# Patient Record
Sex: Male | Born: 1945 | Hispanic: No | Marital: Married | Smoking: Former smoker
Health system: Southern US, Community
[De-identification: ages and names within clinical notes are randomized; demographics above are authoritative.]

## PROBLEM LIST (undated history)

## (undated) DIAGNOSIS — I10 Essential (primary) hypertension: Secondary | ICD-10-CM

## (undated) DIAGNOSIS — E669 Obesity, unspecified: Secondary | ICD-10-CM

## (undated) DIAGNOSIS — IMO0002 Reserved for concepts with insufficient information to code with codable children: Secondary | ICD-10-CM

## (undated) DIAGNOSIS — E7800 Pure hypercholesterolemia, unspecified: Secondary | ICD-10-CM

## (undated) DIAGNOSIS — I4891 Unspecified atrial fibrillation: Secondary | ICD-10-CM

## (undated) HISTORY — DX: Reserved for concepts with insufficient information to code with codable children: IMO0002

## (undated) HISTORY — DX: Unspecified atrial fibrillation: I48.91

## (undated) HISTORY — PX: APPENDECTOMY: SHX54

## (undated) HISTORY — DX: Obesity, unspecified: E66.9

## (undated) HISTORY — PX: HERNIA REPAIR: SHX51

---

## 2007-05-31 ENCOUNTER — Other Ambulatory Visit: Admission: RE | Admit: 2007-05-31 | Discharge: 2007-05-31 | Payer: Self-pay | Admitting: Family Medicine

## 2012-02-18 ENCOUNTER — Inpatient Hospital Stay (HOSPITAL_COMMUNITY)
Admission: EM | Admit: 2012-02-18 | Discharge: 2012-02-21 | DRG: 418 | Disposition: A | Discharge status: Home / Self Care | Payer: Medicare Other | Attending: General Surgery | Admitting: General Surgery

## 2012-02-18 ENCOUNTER — Encounter (HOSPITAL_COMMUNITY): Payer: Self-pay | Admitting: Family Medicine

## 2012-02-18 ENCOUNTER — Emergency Department (HOSPITAL_COMMUNITY): Payer: Medicare Other

## 2012-02-18 DIAGNOSIS — I4891 Unspecified atrial fibrillation: Secondary | ICD-10-CM | POA: Diagnosis present

## 2012-02-18 DIAGNOSIS — I4821 Permanent atrial fibrillation: Secondary | ICD-10-CM | POA: Diagnosis present

## 2012-02-18 DIAGNOSIS — Z6839 Body mass index (BMI) 39.0-39.9, adult: Secondary | ICD-10-CM

## 2012-02-18 DIAGNOSIS — K8001 Calculus of gallbladder with acute cholecystitis with obstruction: Principal | ICD-10-CM | POA: Diagnosis present

## 2012-02-18 DIAGNOSIS — E785 Hyperlipidemia, unspecified: Secondary | ICD-10-CM | POA: Diagnosis present

## 2012-02-18 DIAGNOSIS — K810 Acute cholecystitis: Secondary | ICD-10-CM

## 2012-02-18 DIAGNOSIS — D72829 Elevated white blood cell count, unspecified: Secondary | ICD-10-CM

## 2012-02-18 DIAGNOSIS — K8000 Calculus of gallbladder with acute cholecystitis without obstruction: Secondary | ICD-10-CM

## 2012-02-18 DIAGNOSIS — Z01810 Encounter for preprocedural cardiovascular examination: Secondary | ICD-10-CM

## 2012-02-18 DIAGNOSIS — I10 Essential (primary) hypertension: Secondary | ICD-10-CM | POA: Diagnosis present

## 2012-02-18 DIAGNOSIS — N179 Acute kidney failure, unspecified: Secondary | ICD-10-CM | POA: Diagnosis present

## 2012-02-18 HISTORY — DX: Essential (primary) hypertension: I10

## 2012-02-18 HISTORY — DX: Pure hypercholesterolemia, unspecified: E78.00

## 2012-02-18 LAB — COMPREHENSIVE METABOLIC PANEL
ALT: 14 U/L (ref 0–53)
AST: 14 U/L (ref 0–37)
Alkaline Phosphatase: 67 U/L (ref 39–117)
CO2: 23 mEq/L (ref 19–32)
Calcium: 9.3 mg/dL (ref 8.4–10.5)
GFR calc non Af Amer: 54 mL/min — ABNORMAL LOW (ref >90)
Potassium: 3.9 mEq/L (ref 3.5–5.1)
Sodium: 136 mEq/L (ref 135–145)
Total Protein: 7.3 g/dL (ref 6.0–8.3)

## 2012-02-18 LAB — DIFFERENTIAL
Basophils Relative: 0 % (ref 0–1)
Eosinophils Absolute: 0 10*3/uL (ref 0.0–0.7)
Monocytes Absolute: 2.1 10*3/uL — ABNORMAL HIGH (ref 0.1–1.0)
Monocytes Relative: 10 % (ref 3–12)

## 2012-02-18 LAB — CBC
HCT: 39.4 % (ref 39.0–52.0)
Hemoglobin: 13.7 g/dL (ref 13.0–17.0)
MCH: 34.1 pg — ABNORMAL HIGH (ref 26.0–34.0)
MCHC: 34.8 g/dL (ref 30.0–36.0)

## 2012-02-18 LAB — URINALYSIS, ROUTINE W REFLEX MICROSCOPIC
Ketones, ur: NEGATIVE mg/dL
Protein, ur: 100 mg/dL — AB
Urobilinogen, UA: 1 mg/dL (ref 0.0–1.0)

## 2012-02-18 LAB — URINE MICROSCOPIC-ADD ON

## 2012-02-18 MED ORDER — DIPHENHYDRAMINE HCL 50 MG/ML IJ SOLN: 12.5000 mg | Freq: Four times a day (QID) | INTRAMUSCULAR | Status: DC | PRN

## 2012-02-18 MED ORDER — ONDANSETRON HCL 4 MG/2ML IJ SOLN
4.0000 mg | Freq: Once | INTRAMUSCULAR | Status: AC
  Administered 2012-02-18: 4 mg via INTRAVENOUS
  Filled 2012-02-18: qty 2

## 2012-02-18 MED ORDER — METOPROLOL TARTRATE 25 MG PO TABS
25.0000 mg | ORAL_TABLET | Freq: Once | ORAL | Status: AC
  Administered 2012-02-18: 25 mg via ORAL
  Filled 2012-02-18: qty 1

## 2012-02-18 MED ORDER — DIPHENHYDRAMINE HCL 12.5 MG/5ML PO ELIX: 12.5000 mg | ORAL_SOLUTION | Freq: Four times a day (QID) | ORAL | Status: DC | PRN

## 2012-02-18 MED ORDER — ACETAMINOPHEN 325 MG PO TABS
650.0000 mg | ORAL_TABLET | Freq: Four times a day (QID) | ORAL | Status: DC | PRN
  Administered 2012-02-18: 650 mg via ORAL
  Filled 2012-02-18 (×2): qty 2

## 2012-02-18 MED ORDER — SODIUM CHLORIDE 0.9 % IV BOLUS (SEPSIS)
1000.0000 mL | Freq: Once | INTRAVENOUS | Status: AC
  Administered 2012-02-18: 1000 mL via INTRAVENOUS

## 2012-02-18 MED ORDER — PIPERACILLIN-TAZOBACTAM 3.375 G IVPB: 3.3750 g | Freq: Three times a day (TID) | INTRAVENOUS | Status: DC

## 2012-02-18 MED ORDER — SODIUM CHLORIDE 0.9 % IV SOLN
3.0000 g | Freq: Four times a day (QID) | INTRAVENOUS | Status: DC
  Administered 2012-02-18 – 2012-02-21 (×10): 3 g via INTRAVENOUS
  Filled 2012-02-18 (×12): qty 3

## 2012-02-18 MED ORDER — METOPROLOL TARTRATE 50 MG PO TABS
50.0000 mg | ORAL_TABLET | Freq: Two times a day (BID) | ORAL | Status: DC
  Administered 2012-02-19 – 2012-02-21 (×5): 50 mg via ORAL
  Filled 2012-02-18 (×6): qty 1

## 2012-02-18 MED ORDER — ACETAMINOPHEN 650 MG RE SUPP: 650.0000 mg | Freq: Four times a day (QID) | RECTAL | Status: DC | PRN

## 2012-02-18 MED ORDER — PANTOPRAZOLE SODIUM 40 MG IV SOLR
40.0000 mg | Freq: Every day | INTRAVENOUS | Status: DC
  Administered 2012-02-18 – 2012-02-20 (×3): 40 mg via INTRAVENOUS
  Filled 2012-02-18 (×4): qty 40

## 2012-02-18 MED ORDER — MORPHINE SULFATE 2 MG/ML IJ SOLN
1.0000 mg | INTRAMUSCULAR | Status: DC | PRN
Start: 2012-02-18 — End: 2012-02-19
  Administered 2012-02-18: 2 mg via INTRAVENOUS
  Filled 2012-02-18: qty 1

## 2012-02-18 MED ORDER — SODIUM CHLORIDE 0.9 % IV SOLN
3.0000 g | Freq: Once | INTRAVENOUS | Status: AC
  Administered 2012-02-18 – 2012-02-19 (×2): 3 g via INTRAVENOUS
  Filled 2012-02-18: qty 3

## 2012-02-18 MED ORDER — KCL IN DEXTROSE-NACL 20-5-0.45 MEQ/L-%-% IV SOLN
INTRAVENOUS | Status: DC
  Administered 2012-02-18 – 2012-02-19 (×2): via INTRAVENOUS
  Filled 2012-02-18 (×4): qty 1000

## 2012-02-18 MED ORDER — MORPHINE SULFATE 4 MG/ML IJ SOLN
4.0000 mg | Freq: Once | INTRAMUSCULAR | Status: AC
  Administered 2012-02-18: 4 mg via INTRAVENOUS
  Filled 2012-02-18: qty 1

## 2012-02-18 NOTE — ED Notes (Signed)
Patient transported to X-ray 

## 2012-02-18 NOTE — ED Notes (Signed)
Pt returned from xray

## 2012-02-18 NOTE — ED Notes (Signed)
Pt reports he began having abdominal cramping with N/V 5 days ago. States has not had any vomiting since Tuesday. Denies nausea at this time but reports continued abdominal pain.  Reports having increased weakness and confusion. States urine is dark brown. Went to pcp this morning and was referred here for kidney failure.

## 2012-02-18 NOTE — ED Provider Notes (Addendum)
History     CSN: [id number]  Arrival date & time 02/18/12  1013   First MD Initiated Contact with Patient 02/18/12 1037      Chief Complaint  Patient presents with  . Acute Renal Failure    sent by pcp    (Consider location/radiation/quality/duration/timing/severity/associated sxs/prior treatment) The history is provided by the patient, the spouse and medical records.   the patient is a 66 year old, morbidly obese, male, with a history of hypertension, and hypercholesterolemia.  He states that 5 days ago, he developed abdominal pain.  He went to the bathroom, and while he was having a hard bowel movement.  He started vomiting.  Since then, he has had persistent pain, and vomiting.  His wife returned from work today, and thought that he was confused, so they went to her primary care physician or he was examined and then sent to the emergency department for evaluation.  He denies cough, or shortness of breath.  He has had chills, but no fever.  He history of an appendectomy, and hernia repair.  He drinks alcohol.  He has never had peptic ulcer disease.  He denies smoking.  Cigarettes.  He denies a history of irregular heartbeat in the past.  5 caveat applies for severe illness and urgent need for intervention  Past Medical History  Diagnosis Date  . Hypertension   . High cholesterol     Past Surgical History  Procedure Date  . Hernia repair   . Appendectomy     History reviewed. No pertinent family history.  History  Substance Use Topics  . Smoking status: Former Games developer  . Smokeless tobacco: Not on file  . Alcohol Use: 1.2 oz/week    2 Glasses of wine per week      Review of Systems  Constitutional: Positive for chills and diaphoresis. Negative for fever.  Respiratory: Negative for cough and shortness of breath.   Cardiovascular: Negative for chest pain.  Gastrointestinal: Positive for nausea, vomiting and abdominal pain.  Neurological: Negative for headaches.    Psychiatric/Behavioral: Positive for confusion.  All other systems reviewed and are negative.    Allergies  Statins  Home Medications   Current Outpatient Rx  Name Route Sig Dispense Refill  . OMEGA-3 FATTY ACIDS 1000 MG PO CAPS Oral Take 4 g by mouth daily.    Marland Kitchen LISINOPRIL 10 MG PO TABS Oral Take 10 mg by mouth daily.    Marland Kitchen OMEPRAZOLE 20 MG PO CPDR Oral Take 20 mg by mouth daily.      BP 144/89  Pulse 111  Temp 99 F (37.2 C)  Resp 22  SpO2 97%  Physical Exam  Nursing note and vitals reviewed. Constitutional: He is oriented to person, place, and time. No distress.       Morbidly obese  HENT:  Head: Normocephalic and atraumatic.  Eyes: Conjunctivae and EOM are normal.  Neck: Normal range of motion. Neck supple.  Cardiovascular:  No murmur heard.      Tachycardia with irregular heartbeat  Pulmonary/Chest: Effort normal and breath sounds normal. No respiratory distress.  Abdominal: Soft. Bowel sounds are normal. He exhibits distension. There is tenderness. There is guarding. There is no rebound.       Right upper quadrant tenderness, with guarding No Left Sided tenderness No right lower quadrant tenderness  Neurological: He is alert and oriented to person, place, and time.  Skin: Skin is warm and dry.  Psychiatric: He has a normal mood and affect. Thought content  normal.    ED Course  Procedures (including critical care time) 66 year old, male, with abdominal pain, and nausea and vomiting for 5 days, presents to emergency department with right upper quadrant tenderness, and new-onset atrial fibrillation.   Labs Reviewed  COMPREHENSIVE METABOLIC PANEL  CBC  DIFFERENTIAL  URINALYSIS, ROUTINE W REFLEX MICROSCOPIC  LIPASE, BLOOD   No results found.   No diagnosis found.  ECG Atrial fibrillation, with rapid ventricular response, heart rate 126 beats per minute. Normal axis. PVCs. Nonspecific T-wave changes  2:56 PM abd pain improved.  Last meal  yesterday.    IV ABXS ordered. Consult to surg  3:11 PM Spoke with surgery.  They will come see her.   MDM  Abdominal pain Atrial fibrillation - new Acute cholecystitis         Cheri Guppy, MD 02/18/12 1458  Cheri Guppy, MD 02/18/12 [phone]

## 2012-02-18 NOTE — ED Notes (Signed)
US at bedside

## 2012-02-18 NOTE — ED Notes (Signed)
Pt given urinal and told of need for urine specimen. NAD noted at this time. Family remains at bedside. Will continue to monitor pt.

## 2012-02-18 NOTE — ED Notes (Signed)
[place] states she will be in dept shortly

## 2012-02-18 NOTE — Anesthesia Preprocedure Evaluation (Addendum)
Anesthesia Evaluation  Patient identified by MRN, date of birth, ID band Patient awake    Reviewed: Allergy & Precautions, H&P , NPO status , Patient's Chart, lab work & pertinent test results  Airway Mallampati: III TM Distance: >3 FB Neck ROM: full    Dental  (+) Edentulous Upper, Edentulous Lower and Dental Advisory Given   Pulmonary neg pulmonary ROS,  breath sounds clear to auscultation  Pulmonary exam normal       Cardiovascular Exercise Tolerance: Good hypertension, Pt. on medications negative cardio ROS  + dysrhythmias Atrial Fibrillation Rhythm:Irregular Rate:Normal  ? New onset A. Fib. Seen by cardiology last night and cleared for surgery.  Rate OK.   Neuro/Psych negative neurological ROS  negative psych ROS   GI/Hepatic negative GI ROS, Neg liver ROS,   Endo/Other  negative endocrine ROSMorbid obesity  Renal/GU negative Renal ROS  negative genitourinary   Musculoskeletal   Abdominal (+) + obese,   Peds  Hematology negative hematology ROS (+)   Anesthesia Other Findings   Reproductive/Obstetrics negative OB ROS                         Anesthesia Physical Anesthesia Plan  ASA: III  Anesthesia Plan: General   Post-op Pain Management:    Induction: Intravenous  Airway Management Planned: Oral ETT  Additional Equipment:   Intra-op Plan:   Post-operative Plan: Extubation in OR  Informed Consent: I have reviewed the patients History and Physical, chart, labs and discussed the procedure including the risks, benefits and alternatives for the proposed anesthesia with the patient or authorized representative who has indicated his/her understanding and acceptance.   Dental Advisory Given  Plan Discussed with: CRNA and Surgeon  Anesthesia Plan Comments:         Anesthesia Quick Evaluation

## 2012-02-18 NOTE — Consult Note (Signed)
Reason for Consult:Peri-operative cardiac risk assessment Referring Physician: Dr Ovidio Kin   [name] [name] is an 66 y.o. male with a past medical history significant for HTN, hyperlipidemia and chronic low back pain. HPI: He was admitted for abdominal pain and diagnosed with acute cholecystitis for which he requires urgent surgery tomorrow morning. Incidentally, he was found to be in AF with a HR varying between 100-126b/m hence the consult for perioperative risk assessment. This is the first time anyone has ever mentioned to him that he has AF. He currently does not feel the palpitations. He denies chest pain, shortness of breath, lightheadedness, orthopnea, PND or leg edema. He has never suffered a heart attack, no hx of CVD, no DM and no renal disease. His last cardiac stress test was in the 90s and was normal. Since then, there has been no need to have him evaluated for CAD. Notably, he rides a stationary bike 10 miles a day, 7days a week without trouble. 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   Past Medical History  Diagnosis Date  . Hypertension   . High cholesterol     Past Surgical History  Procedure Date  . Hernia repair   . Appendectomy     History reviewed. No pertinent family history.  Social History:  reports that he has quit smoking. His smoking use included Cigarettes. He has never used smokeless tobacco. He reports that he drinks about 1.2 ounces of alcohol per week. He reports that he does not use illicit drugs.  Allergies:  Allergies  Allergen Reactions  . Statins Other (See Comments)    Liver enzymes     Medications: I have reviewed the patient's current medications.  Results for orders placed during the hospital encounter of 02/18/12 (from the past 48 hour(s))  COMPREHENSIVE METABOLIC PANEL     Status: Abnormal   Collection Time   02/18/12 10:45 AM      Component Value Range Comment   Sodium 136  135 - 145 (mEq/L)    Potassium 3.9  3.5 - 5.1 (mEq/L)    Chloride 99  96 - 112 (mEq/L)    CO2 23  19 - 32 (mEq/L)    Glucose, Bld 110 (*) 70 - 99 (mg/dL)    BUN 22  6 - 23 (mg/dL)    Creatinine, Ser 4.78  0.50 - 1.35 (mg/dL)    Calcium 9.3  8.4 - 10.5 (mg/dL)    Total Protein 7.3  6.0 - 8.3 (g/dL)    Albumin 3.2 (*) 3.5 - 5.2 (g/dL)    AST 14  0 - 37 (U/L)    ALT 14  0 - 53 (U/L)    Alkaline Phosphatase 67  39 - 117 (U/L)    Total Bilirubin 1.2  0.3 - 1.2 (mg/dL)    GFR calc non Af Amer 54 (*) >90 (mL/min)    GFR calc Af Amer 63 (*) >90 (mL/min)   CBC     Status: Abnormal   Collection Time   02/18/12 10:45 AM      Component Value Range Comment   WBC 20.6 (*) 4.0 - 10.5 (K/uL)    RBC 4.02 (*) 4.22 - 5.81 (MIL/uL)    Hemoglobin 13.7  13.0 - 17.0 (g/dL)    HCT 29.5  62.1 - 30.8 (%)    MCV 98.0  78.0 - 100.0 (fL)    MCH 34.1 (*) 26.0 - 34.0 (pg)    MCHC 34.8  30.0 - 36.0 (g/dL)    RDW 16.1  09.6 - 04.5 (%)    Platelets 184  150 - 400 (K/uL)   DIFFERENTIAL     Status: Abnormal   Collection Time   02/18/12 10:45 AM      Component Value Range Comment   Neutrophils Relative 80 (*) 43 - 77 (%)    Neutro Abs 16.4 (*) 1.7 - 7.7 (K/uL)    Lymphocytes Relative 10 (*) 12 - 46 (%)    Lymphs Abs 2.0  0.7 - 4.0 (K/uL)    Monocytes Relative 10  3 - 12 (%)    Monocytes Absolute 2.1 (*) 0.1 - 1.0 (K/uL)    Eosinophils Relative 0  0 - 5 (%)    Eosinophils Absolute 0.0  0.0 - 0.7 (K/uL)    Basophils Relative 0  0 - 1 (%)    Basophils Absolute 0.0  0.0 - 0.1 (K/uL)   LIPASE, BLOOD     Status: Normal   Collection Time   02/18/12 10:45 AM      Component Value Range Comment   Lipase 16  11 - 59 (U/L)   POCT I-STAT TROPONIN I     Status: Normal   Collection Time   02/18/12 10:58 AM      Component Value Range Comment   Troponin i, poc 0.02  0.00 - 0.08 (ng/mL)    Comment 3            URINALYSIS, ROUTINE W REFLEX MICROSCOPIC     Status: Abnormal   Collection Time   02/18/12 11:50 AM      Component Value Range  Comment   Color, Urine AMBER (*) YELLOW  BIOCHEMICALS MAY BE AFFECTED BY COLOR   APPearance CLOUDY (*) CLEAR     Specific Gravity, Urine 1.020  1.005 - 1.030     pH 5.5  5.0 - 8.0     Glucose, UA NEGATIVE  NEGATIVE (mg/dL)    Hgb urine dipstick LARGE (*) NEGATIVE     Bilirubin Urine SMALL (*) NEGATIVE     Ketones, ur NEGATIVE  NEGATIVE (mg/dL)    Protein, ur 409 (*) NEGATIVE (mg/dL)    Urobilinogen, UA 1.0  0.0 - 1.0 (mg/dL)    Nitrite NEGATIVE  NEGATIVE     Leukocytes, UA SMALL (*) NEGATIVE    URINE MICROSCOPIC-ADD ON     Status: Abnormal   Collection Time   02/18/12 11:50 AM      Component Value Range Comment   Squamous Epithelial / LPF FEW (*) RARE     WBC, UA 7-10  <3 (WBC/hpf)    Bacteria, UA FEW (*) RARE     Urine-Other MUCOUS PRESENT       US Abdomen Complete  02/18/2012  *RADIOLOGY REPORT*  Clinical Data:  Question cholecystitis.  COMPLETE ABDOMINAL ULTRASOUND  Comparison:  Acute abdominal series 02/18/2012  Findings:  Gallbladder:  There is a 2 cm gallstone in the gallbladder neck. There is gallbladder wall thickening, measuring up to 8 mm.  There is suggestion of a small amount of pericholecystic fluid.  The sonographic Murphy's sign is positive.  Common bile duct:  Measures 4.4 mm were imaged.  Liver:  No focal lesion identified.  Within normal limits in parenchymal echogenicity.  IVC:  Appears normal.  Pancreas:  The pancreas is not well visualized, due to bowel gas in this region.  Spleen:  Normal size and echotexture without focal parenchymal abnormality.  Right Kidney:  No hydronephrosis.  Well-preserved cortex.  Normal size and parenchymal echotexture without focal abnormalities.  Left Kidney:  No hydronephrosis.  Well-preserved cortex.  Normal size and parenchymal echotexture .  Upper pole 1.9 x 1.7 x 1.7 cm cyst.  Abdominal aorta:  Limited visualization, due to bowel gas.  The proximal abdominal aorta measures 228 cm AP diameter.  The mid to distal abdominal aorta could not  be visualized.  IMPRESSION:  1.  Positive study.  Sonographic findings consistent with acute cholecystitis (stone in the gallbladder neck, gallbladder wall thickening, and positive sonographic Murphy's sign). 2.  Evaluation of the pancreas and mid to distal abdominal aorta are markedly limited due to bowel gas.  Original Report Authenticated By: Britta Mccreedy, M.D.   Dg Abd Acute W/chest  02/18/2012  *RADIOLOGY REPORT*  Clinical Data: Question small bowel obstruction  ACUTE ABDOMEN SERIES (ABDOMEN 2 VIEW & CHEST 1 VIEW)  Comparison: None.  Findings: Heart, mediastinal, hilar contours are normal.  The trachea is midline.  The there is streaky atelectasis in the left lung base.  No visible pleural effusion or pneumothorax.  No evidence of free intraperitoneal air.  Two oval radiopaque densities projecting over the right lower quadrant are likely ingested pills. The bowel gas pattern is nonobstructive.  Three similar appearing calcifications in the left pelvis are most likely phleboliths, and two of them are more inferiorly positioned than the expected location of the left ureter. The most proximal of these calcifications could potentially be a distal left ureteral stone, but a phlebolith is favored.  IMPRESSION:  1.  Nonobstructive bowel gas pattern. 2.  No acute cardiopulmonary disease. 3.  Cannot completely exclude a distal left ureteral stone.  Left pelvic calcifications favored to be phleboliths.  Question if the patient has left flank pain.  Original Report Authenticated By: Britta Mccreedy, M.D.    Review of Systems  Constitutional: Positive for fever and chills.  HENT: Negative.   Eyes: Negative.   Respiratory: Negative.   Cardiovascular: Negative.   Gastrointestinal: Positive for abdominal pain.  Genitourinary: Negative.   Musculoskeletal: Positive for back pain.  Skin: Negative.   Neurological: Negative.   Endo/Heme/Allergies: Negative.   Psychiatric/Behavioral: Negative.    Blood pressure  161/78, pulse 81, temperature 98.1 F (36.7 C), temperature source Oral, resp. rate 20, height 5\' 10"  (1.778 m), weight 277 lb 12.5 oz (126 kg), SpO2 98.00%. Physical Exam  Constitutional: He is oriented to person, place, and time. He appears well-developed and well-nourished. No distress.  HENT:  Head: Normocephalic.  Mouth/Throat: No oropharyngeal exudate.  Eyes: Conjunctivae are normal. Pupils are equal, round, and reactive to light.  Neck: No JVD present.  Cardiovascular: S2 normal and normal pulses.  An irregularly irregular rhythm present. Tachycardia present.  PMI is not displaced.  Exam reveals no gallop, no distant heart sounds and no friction rub.   No murmur heard.      Varying S1  Respiratory: Effort normal and breath sounds normal. No respiratory distress. He has no wheezes. He has no rales. He exhibits no tenderness.  GI: There is tenderness. There is guarding.  Musculoskeletal: Normal range of motion.  Neurological: He is alert and oriented to person, place, and time.  Skin: Skin is warm and dry. No rash noted. He is not diaphoretic.  Psychiatric: He has a normal mood and affect. His behavior is normal. Judgment and thought content normal.   EKG AF at 126b/m.  Assessment/Plan: Newly detected atrial fibrillation. CCS SAF score  0, CHADS2VASc score 2  Patient has no active cardiopulmonary symptoms in-spite of his AF with RVR at 126 b/m. He has no other clinical risk predictors of high peri-operative risks. Being able to ride his stationary bike for 10 miles, 7 days a week is predictive of > 4 METS. His proposed surgery is one with intermediate risk for perioperative risk for cardiac morbidity and mortality but it is also one that requires some urgency. It is prudent to treat his AF with rate control with a goal of maintaining a heart rate of <100 or as close to this as possible without undergoing further testing prior to surgery. I have written for a dose of metoprolol 25 mg to  be given  now, if he tolerates that dose, he will continue in 6 hrs time with 50 mg twice daily. If HR is uncontrolled prior to surgery, he may be started on an infusion of IV Esmolol or diltiazem.Given the risk of stroke in AF patients and his CHADS2VASc score of 2 (1 point for age>65 and another for HTN), he will benefit from prevention with an anticoagulant. This will need to be addressed as soon as it is safe and feasible post op.Upon discharge from the hospital, he will need cardiology follow up of his AF.  It was indeed a pleasure meeting Mr. [employer] and we thank you for the opportunity to take part in his care.   Grandville Silos 02/18/2012, 11:08 PM

## 2012-02-18 NOTE — H&P (Signed)
[name] [name] is an 66 y.o. male.    Primary care: Dr. Juluis Rainier  Chief Complaint: Abdominal pain nausea and vomiting for 3 days. HPI: Patient is a 66 year old gentleman who was in his normal state of health until Tuesday, 02/15/2012. He will get morning with abdominal pain on the right side. He went to the bathroom then developed nausea and vomiting. He's had ongoing episodes of abdominal pain nausea and vomiting since Tuesday. His wife came back to check on him this morning and he was confused. He was seen by Dr. Zachery Dauer office found to have an acute abdomen, dehydration and proteinuria. He was sent to the emergency room and [facility].   Workup here shows a white count of 20,600. Creatinine of 1.3 to BUN of 22. Three-way abdomen was normal except for possible left ureteral stone. Abdominal ultrasound shows a 2 cm gallstone in the neck of the gallbladder gallbladder wall thickening measuring up to 8 mm. There is a suggestion of some pericholecystic fluid and positive Murphy sign. Common bile duct was 4.4 mm. This is consistent with acute cholecystitis. We are asked to see the patient. He's been started on IV Unasyn by the emergency room physician.  Past Medical History  Diagnosis Date  . Hypertension   . High cholesterol     Past Surgical History  Procedure Date  . Hernia repair right   . Appendectomy     History reviewed. No pertinent family history. Social History:  reports that he has quit smoking. His smoking use included Cigarettes. He has never used smokeless tobacco. He reports that he drinks about 1.2 ounces of alcohol per week. He reports that he does not use illicit drugs.Tobacco about 2PPD  15 years, quit early 30's ETOH: 6 pack weekends, 1 bottle of wine per week with meals. Allergies:  Allergies  Allergen Reactions  . Statins Other (See Comments)    Liver enzymes    Prior to Admission medications   Medication Sig Start Date End Date Taking? Authorizing  Provider  fish oil-omega-3 fatty acids 1000 MG capsule Take 4 g by mouth daily.   Yes Historical Provider, MD  lisinopril (PRINIVIL,ZESTRIL) 10 MG tablet Take 10 mg by mouth daily.   Yes Historical Provider, MD  omeprazole (PRILOSEC) 20 MG capsule Take 20 mg by mouth daily.   Yes Historical Provider, MD     (Not in a hospital admission)  Results for orders placed during the hospital encounter of 02/18/12 (from the past 48 hour(s))  COMPREHENSIVE METABOLIC PANEL     Status: Abnormal   Collection Time   02/18/12 10:45 AM      Component Value Range Comment   Sodium 136  135 - 145 (mEq/L)    Potassium 3.9  3.5 - 5.1 (mEq/L)    Chloride 99  96 - 112 (mEq/L)    CO2 23  19 - 32 (mEq/L)    Glucose, Bld 110 (*) 70 - 99 (mg/dL)    BUN 22  6 - 23 (mg/dL)    Creatinine, Ser 7.82  0.50 - 1.35 (mg/dL)    Calcium 9.3  8.4 - 10.5 (mg/dL)    Total Protein 7.3  6.0 - 8.3 (g/dL)    Albumin 3.2 (*) 3.5 - 5.2 (g/dL)    AST 14  0 - 37 (U/L)    ALT 14  0 - 53 (U/L)    Alkaline Phosphatase 67  39 - 117 (U/L)    Total Bilirubin 1.2  0.3 - 1.2 (  mg/dL)    GFR calc non Af Amer 54 (*) >90 (mL/min)    GFR calc Af Amer 63 (*) >90 (mL/min)   CBC     Status: Abnormal   Collection Time   02/18/12 10:45 AM      Component Value Range Comment   WBC 20.6 (*) 4.0 - 10.5 (K/uL)    RBC 4.02 (*) 4.22 - 5.81 (MIL/uL)    Hemoglobin 13.7  13.0 - 17.0 (g/dL)    HCT 16.1  09.6 - 04.5 (%)    MCV 98.0  78.0 - 100.0 (fL)    MCH 34.1 (*) 26.0 - 34.0 (pg)    MCHC 34.8  30.0 - 36.0 (g/dL)    RDW 40.9  81.1 - 91.4 (%)    Platelets 184  150 - 400 (K/uL)   DIFFERENTIAL     Status: Abnormal   Collection Time   02/18/12 10:45 AM      Component Value Range Comment   Neutrophils Relative 80 (*) 43 - 77 (%)    Neutro Abs 16.4 (*) 1.7 - 7.7 (K/uL)    Lymphocytes Relative 10 (*) 12 - 46 (%)    Lymphs Abs 2.0  0.7 - 4.0 (K/uL)    Monocytes Relative 10  3 - 12 (%)    Monocytes Absolute 2.1 (*) 0.1 - 1.0 (K/uL)    Eosinophils  Relative 0  0 - 5 (%)    Eosinophils Absolute 0.0  0.0 - 0.7 (K/uL)    Basophils Relative 0  0 - 1 (%)    Basophils Absolute 0.0  0.0 - 0.1 (K/uL)   LIPASE, BLOOD     Status: Normal   Collection Time   02/18/12 10:45 AM      Component Value Range Comment   Lipase 16  11 - 59 (U/L)   POCT I-STAT TROPONIN I     Status: Normal   Collection Time   02/18/12 10:58 AM      Component Value Range Comment   Troponin i, poc 0.02  0.00 - 0.08 (ng/mL)    Comment 3            URINALYSIS, ROUTINE W REFLEX MICROSCOPIC     Status: Abnormal   Collection Time   02/18/12 11:50 AM      Component Value Range Comment   Color, Urine AMBER (*) YELLOW  BIOCHEMICALS MAY BE AFFECTED BY COLOR   APPearance CLOUDY (*) CLEAR     Specific Gravity, Urine 1.020  1.005 - 1.030     pH 5.5  5.0 - 8.0     Glucose, UA NEGATIVE  NEGATIVE (mg/dL)    Hgb urine dipstick LARGE (*) NEGATIVE     Bilirubin Urine SMALL (*) NEGATIVE     Ketones, ur NEGATIVE  NEGATIVE (mg/dL)    Protein, ur 782 (*) NEGATIVE (mg/dL)    Urobilinogen, UA 1.0  0.0 - 1.0 (mg/dL)    Nitrite NEGATIVE  NEGATIVE     Leukocytes, UA SMALL (*) NEGATIVE    URINE MICROSCOPIC-ADD ON     Status: Abnormal   Collection Time   02/18/12 11:50 AM      Component Value Range Comment   Squamous Epithelial / LPF FEW (*) RARE     WBC, UA 7-10  <3 (WBC/hpf)    Bacteria, UA FEW (*) RARE     Urine-Other MUCOUS PRESENT      US Abdomen Complete  02/18/2012  *RADIOLOGY REPORT*  Clinical Data:  Question cholecystitis.  COMPLETE ABDOMINAL ULTRASOUND  Comparison:  Acute abdominal series 02/18/2012  Findings:  Gallbladder:  There is a 2 cm gallstone in the gallbladder neck. There is gallbladder wall thickening, measuring up to 8 mm.  There is suggestion of a small amount of pericholecystic fluid.  The sonographic Murphy's sign is positive.  Common bile duct:  Measures 4.4 mm were imaged.  Liver:  No focal lesion identified.  Within normal limits in parenchymal echogenicity.  IVC:   Appears normal.  Pancreas:  The pancreas is not well visualized, due to bowel gas in this region.  Spleen:  Normal size and echotexture without focal parenchymal abnormality.  Right Kidney:  No hydronephrosis.  Well-preserved cortex.  Normal size and parenchymal echotexture without focal abnormalities.  Left Kidney:  No hydronephrosis.  Well-preserved cortex.  Normal size and parenchymal echotexture .  Upper pole 1.9 x 1.7 x 1.7 cm cyst.  Abdominal aorta:  Limited visualization, due to bowel gas.  The proximal abdominal aorta measures 228 cm AP diameter.  The mid to distal abdominal aorta could not be visualized.  IMPRESSION:  1.  Positive study.  Sonographic findings consistent with acute cholecystitis (stone in the gallbladder neck, gallbladder wall thickening, and positive sonographic Murphy's sign). 2.  Evaluation of the pancreas and mid to distal abdominal aorta are markedly limited due to bowel gas.  Original Report Authenticated By: Britta Mccreedy, M.D.   Dg Abd Acute W/chest  02/18/2012  *RADIOLOGY REPORT*  Clinical Data: Question small bowel obstruction  ACUTE ABDOMEN SERIES (ABDOMEN 2 VIEW & CHEST 1 VIEW)  Comparison: None.  Findings: Heart, mediastinal, hilar contours are normal.  The trachea is midline.  The there is streaky atelectasis in the left lung base.  No visible pleural effusion or pneumothorax.  No evidence of free intraperitoneal air.  Two oval radiopaque densities projecting over the right lower quadrant are likely ingested pills. The bowel gas pattern is nonobstructive.  Three similar appearing calcifications in the left pelvis are most likely phleboliths, and two of them are more inferiorly positioned than the expected location of the left ureter. The most proximal of these calcifications could potentially be a distal left ureteral stone, but a phlebolith is favored.  IMPRESSION:  1.  Nonobstructive bowel gas pattern. 2.  No acute cardiopulmonary disease. 3.  Cannot completely exclude a  distal left ureteral stone.  Left pelvic calcifications favored to be phleboliths.  Question if the patient has left flank pain.  Original Report Authenticated By: Britta Mccreedy, M.D.    Review of Systems  Constitutional: Positive for fever, chills, malaise/fatigue and diaphoresis. Negative for weight loss.  HENT: Negative.   Eyes: Negative.   Respiratory: Negative.   Cardiovascular: Negative.   Gastrointestinal: Positive for nausea, vomiting, abdominal pain and constipation. Negative for heartburn, diarrhea, blood in stool and melena.  Genitourinary: Negative.   Musculoskeletal: Positive for back pain.       Disc dz/spinal stenosis  Skin: Negative.   Neurological: Negative.  Negative for weakness.  Endo/Heme/Allergies: Negative.   Psychiatric/Behavioral: Negative.     Blood pressure 112/69, pulse 108, temperature 99.5 F (37.5 C), temperature source Oral, resp. rate 19, SpO2 97.00%. Physical Exam  Constitutional: He is oriented to person, place, and time. He appears well-developed and well-nourished.       bmi 39.7  HENT:  Head: Normocephalic and atraumatic.  Nose: Nose normal.  Mouth/Throat: Oropharynx is clear and moist.  Eyes: Conjunctivae and EOM are normal. Pupils are equal, round, and reactive to light.  Right eye exhibits no discharge. Left eye exhibits no discharge. No scleral icterus.  Neck: Normal range of motion. Neck supple. No JVD present. No tracheal deviation present. No thyromegaly present.  Cardiovascular: Normal rate, regular rhythm, normal heart sounds and intact distal pulses.  Exam reveals no gallop.   No murmur heard. Respiratory: Effort normal and breath sounds normal. No respiratory distress. He has no wheezes. He has no rales. He exhibits no tenderness.  GI: Soft. Bowel sounds are normal. He exhibits no distension. There is tenderness (RUQ). There is rebound (ruq) and guarding.  Musculoskeletal: Normal range of motion. He exhibits no edema.  Lymphadenopathy:     He has no cervical adenopathy.  Neurological: He is alert and oriented to person, place, and time. He has normal reflexes. No cranial nerve deficit. Coordination normal.  Skin: Skin is warm and dry. No rash noted. No erythema. No pallor.  Psychiatric: He has a normal mood and affect. His behavior is normal. Judgment and thought content normal.     Assessment/Plan 1. Acute cholecystitis, cholelithiasis.  Will need lap chole. 2. Dehydration 3. Hypertension 4. Dyslipidemia 6. BMI 39.7 7. Disc disease and some spinal stenosis 8.  New onset atrial fibrillation.  Plan: Will admit rehydrate start antibiotic and discuss cholecystectomy timing with Dr. Ezzard Standing.  JENNINGS,WILLARD 02/18/2012, 4:07 PM   The wife and daughter are at the bed side.  They have both had lap cholecystectomies! In addition to his gall bladder disease, Mr. Rutledge has new onset A. Fib.  I spoke with Dr. Michele Rockers, who will see the patient in consultation regarding the atrial fibrillation.  If there are no significant cardiac issues, I plan cholecystectomy in the AM.  I discussed with the patient the indications and risks of gall bladder surgery.  The primary risks of gall bladder surgery include, but are not limited to, bleeding, infection, common bile duct injury, and open surgery.  There is also the risk that the patient may have continued symptoms after surgery.  However, the likelihood of improvement in symptoms and return to the patient's normal status is good. We discussed the typical post-operative recovery course. I tried to answer the patient's questions. I drew a picture of the planned surgery.  Ovidio Kin, MD, [facility] Pager: [phone] Office phone:  [phone]

## 2012-02-18 NOTE — ED Notes (Signed)
MD at bedside. 

## 2012-02-19 ENCOUNTER — Encounter (HOSPITAL_COMMUNITY): Payer: Self-pay | Admitting: Anesthesiology

## 2012-02-19 ENCOUNTER — Inpatient Hospital Stay (HOSPITAL_COMMUNITY): Payer: Medicare Other

## 2012-02-19 ENCOUNTER — Encounter (HOSPITAL_COMMUNITY): Admission: EM | Disposition: A | Discharge status: Home / Self Care | Payer: Self-pay | Source: Home / Self Care

## 2012-02-19 ENCOUNTER — Inpatient Hospital Stay (HOSPITAL_COMMUNITY): Payer: Medicare Other | Admitting: Anesthesiology

## 2012-02-19 HISTORY — PX: CHOLECYSTECTOMY: SHX55

## 2012-02-19 LAB — SURGICAL PCR SCREEN
MRSA, PCR: NEGATIVE
Staphylococcus aureus: NEGATIVE

## 2012-02-19 LAB — CBC
HCT: 35 % — ABNORMAL LOW (ref 39.0–52.0)
Hemoglobin: 11.8 g/dL — ABNORMAL LOW (ref 13.0–17.0)
MCHC: 33.7 g/dL (ref 30.0–36.0)
RBC: 3.56 MIL/uL — ABNORMAL LOW (ref 4.22–5.81)
WBC: 13.2 10*3/uL — ABNORMAL HIGH (ref 4.0–10.5)

## 2012-02-19 LAB — BASIC METABOLIC PANEL
CO2: 25 mEq/L (ref 19–32)
Chloride: 100 mEq/L (ref 96–112)
Creatinine, Ser: 1.12 mg/dL (ref 0.50–1.35)
GFR calc Af Amer: 77 mL/min — ABNORMAL LOW (ref >90)
Potassium: 4.3 mEq/L (ref 3.5–5.1)
Sodium: 134 mEq/L — ABNORMAL LOW (ref 135–145)

## 2012-02-19 LAB — MAGNESIUM: Magnesium: 2.2 mg/dL (ref 1.5–2.5)

## 2012-02-19 SURGERY — LAPAROSCOPIC CHOLECYSTECTOMY WITH INTRAOPERATIVE CHOLANGIOGRAM
Anesthesia: General | Site: Abdomen | Wound class: Contaminated

## 2012-02-19 MED ORDER — ENOXAPARIN SODIUM 40 MG/0.4ML ~~LOC~~ SOLN
40.0000 mg | SUBCUTANEOUS | Status: DC
  Administered 2012-02-19: 40 mg via SUBCUTANEOUS
  Filled 2012-02-19 (×2): qty 0.4

## 2012-02-19 MED ORDER — PHENYLEPHRINE HCL 10 MG/ML IJ SOLN
INTRAMUSCULAR | Status: DC | PRN
  Administered 2012-02-19 (×2): 40 ug via INTRAVENOUS
  Administered 2012-02-19: 80 ug via INTRAVENOUS

## 2012-02-19 MED ORDER — HYDROCODONE-ACETAMINOPHEN 5-325 MG PO TABS
1.0000 | ORAL_TABLET | ORAL | Status: DC | PRN
  Administered 2012-02-20 – 2012-02-21 (×3): 2 via ORAL
  Filled 2012-02-19 (×3): qty 2

## 2012-02-19 MED ORDER — LACTATED RINGERS IV SOLN: INTRAVENOUS | Status: DC

## 2012-02-19 MED ORDER — LACTATED RINGERS IR SOLN
Status: DC | PRN
  Administered 2012-02-19: 1000 mL

## 2012-02-19 MED ORDER — HYDROMORPHONE HCL PF 1 MG/ML IJ SOLN: 0.2500 mg | INTRAMUSCULAR | Status: DC | PRN

## 2012-02-19 MED ORDER — BUPIVACAINE HCL (PF) 0.25 % IJ SOLN
INTRAMUSCULAR | Status: DC | PRN
  Administered 2012-02-19: 22 mL

## 2012-02-19 MED ORDER — MIDAZOLAM HCL 5 MG/5ML IJ SOLN
INTRAMUSCULAR | Status: DC | PRN
  Administered 2012-02-19: 2 mg via INTRAVENOUS

## 2012-02-19 MED ORDER — LIDOCAINE HCL (CARDIAC) 20 MG/ML IV SOLN
INTRAVENOUS | Status: DC | PRN
  Administered 2012-02-19: 50 mg via INTRAVENOUS

## 2012-02-19 MED ORDER — NEOSTIGMINE METHYLSULFATE 1 MG/ML IJ SOLN
INTRAMUSCULAR | Status: DC | PRN
  Administered 2012-02-19: 4 mg via INTRAVENOUS

## 2012-02-19 MED ORDER — HYDROMORPHONE HCL PF 1 MG/ML IJ SOLN
INTRAMUSCULAR | Status: DC | PRN
  Administered 2012-02-19 (×2): 0.5 mg via INTRAVENOUS
  Administered 2012-02-19: 1 mg via INTRAVENOUS

## 2012-02-19 MED ORDER — GLYCOPYRROLATE 0.2 MG/ML IJ SOLN
INTRAMUSCULAR | Status: DC | PRN
  Administered 2012-02-19: .7 mg via INTRAVENOUS

## 2012-02-19 MED ORDER — ONDANSETRON HCL 4 MG/2ML IJ SOLN
4.0000 mg | Freq: Four times a day (QID) | INTRAMUSCULAR | Status: DC | PRN
Start: 2012-02-19 — End: 2012-02-21

## 2012-02-19 MED ORDER — 0.9 % SODIUM CHLORIDE (POUR BTL) OPTIME
TOPICAL | Status: DC | PRN
  Administered 2012-02-19: 6000 mL

## 2012-02-19 MED ORDER — MORPHINE SULFATE 2 MG/ML IJ SOLN
1.0000 mg | INTRAMUSCULAR | Status: DC | PRN
  Administered 2012-02-19 – 2012-02-20 (×3): 2 mg via INTRAVENOUS
  Filled 2012-02-19 (×3): qty 1

## 2012-02-19 MED ORDER — IOHEXOL 300 MG/ML  SOLN
INTRAMUSCULAR | Status: DC | PRN
  Administered 2012-02-19: 50 mL

## 2012-02-19 MED ORDER — ROCURONIUM BROMIDE 100 MG/10ML IV SOLN
INTRAVENOUS | Status: DC | PRN
  Administered 2012-02-19: 50 mg via INTRAVENOUS
  Administered 2012-02-19 (×3): 10 mg via INTRAVENOUS

## 2012-02-19 MED ORDER — ONDANSETRON HCL 4 MG PO TABS: 4.0000 mg | ORAL_TABLET | Freq: Four times a day (QID) | ORAL | Status: DC | PRN

## 2012-02-19 MED ORDER — LACTATED RINGERS IV SOLN
INTRAVENOUS | Status: DC | PRN
  Administered 2012-02-19: 10:00:00 via INTRAVENOUS

## 2012-02-19 MED ORDER — PROPOFOL 10 MG/ML IV BOLUS
INTRAVENOUS | Status: DC | PRN
  Administered 2012-02-19: 200 mg via INTRAVENOUS

## 2012-02-19 MED ORDER — ONDANSETRON HCL 4 MG/2ML IJ SOLN
INTRAMUSCULAR | Status: DC | PRN
  Administered 2012-02-19: 4 mg via INTRAVENOUS

## 2012-02-19 MED ORDER — FENTANYL CITRATE 0.05 MG/ML IJ SOLN
INTRAMUSCULAR | Status: DC | PRN
  Administered 2012-02-19: 50 ug via INTRAVENOUS
  Administered 2012-02-19: 100 ug via INTRAVENOUS
  Administered 2012-02-19 (×2): 50 ug via INTRAVENOUS

## 2012-02-19 MED ORDER — KCL IN DEXTROSE-NACL 20-5-0.45 MEQ/L-%-% IV SOLN
INTRAVENOUS | Status: DC
  Administered 2012-02-19 – 2012-02-20 (×2): via INTRAVENOUS
  Administered 2012-02-20: 1000 mL via INTRAVENOUS
  Filled 2012-02-19 (×4): qty 1000

## 2012-02-19 SURGICAL SUPPLY — 47 items
APPLIER CLIP 5 13 M/L LIGAMAX5 (MISCELLANEOUS) ×2
APPLIER CLIP ROT 10 11.4 M/L (STAPLE) ×2
BENZOIN TINCTURE PRP APPL 2/3 (GAUZE/BANDAGES/DRESSINGS) IMPLANT
CABLE HI FREQUENCY MONOPOLAR (ELECTROSURGICAL) ×2 IMPLANT
CANISTER SUCTION 2500CC (MISCELLANEOUS) ×2 IMPLANT
CHLORAPREP W/TINT 26ML (MISCELLANEOUS) ×2 IMPLANT
CHOLANGIOGRAM CATH TAUT (CATHETERS) ×2 IMPLANT
CLIP APPLIE 5 13 M/L LIGAMAX5 (MISCELLANEOUS) ×1 IMPLANT
CLIP APPLIE ROT 10 11.4 M/L (STAPLE) ×1 IMPLANT
CLOTH BEACON ORANGE TIMEOUT ST (SAFETY) ×2 IMPLANT
COVER MAYO STAND STRL (DRAPES) ×2 IMPLANT
DECANTER SPIKE VIAL GLASS SM (MISCELLANEOUS) ×2 IMPLANT
DERMABOND ADVANCED (GAUZE/BANDAGES/DRESSINGS) ×1
DERMABOND ADVANCED .7 DNX12 (GAUZE/BANDAGES/DRESSINGS) ×1 IMPLANT
DRAPE C-ARM 42X72 X-RAY (DRAPES) ×2 IMPLANT
DRAPE LAPAROSCOPIC ABDOMINAL (DRAPES) ×2 IMPLANT
ELECT REM PT RETURN 9FT ADLT (ELECTROSURGICAL) ×2
ELECTRODE REM PT RTRN 9FT ADLT (ELECTROSURGICAL) ×1 IMPLANT
GLOVE BIOGEL PI IND STRL 7.0 (GLOVE) ×1 IMPLANT
GLOVE BIOGEL PI INDICATOR 7.0 (GLOVE) ×1
GLOVE SURG SIGNA 7.5 PF LTX (GLOVE) ×2 IMPLANT
GOWN STRL NON-REIN LRG LVL3 (GOWN DISPOSABLE) ×2 IMPLANT
GOWN STRL REIN XL XLG (GOWN DISPOSABLE) ×4 IMPLANT
HEMOSTAT SURGICEL 4X8 (HEMOSTASIS) IMPLANT
HOVERMATT SINGLE USE (MISCELLANEOUS) ×2 IMPLANT
IV CATH 14GX2 1/4 (CATHETERS) ×2 IMPLANT
IV SET EXT 30 76VOL 4 MALE LL (IV SETS) ×2 IMPLANT
KIT BASIN OR (CUSTOM PROCEDURE TRAY) ×2 IMPLANT
NS IRRIG 1000ML POUR BTL (IV SOLUTION) ×2 IMPLANT
POUCH SPECIMEN RETRIEVAL 10MM (ENDOMECHANICALS) ×2 IMPLANT
SCISSORS MNPLR CVD DVNC (INSTRUMENTS) ×1 IMPLANT
SCISSORS MONOPOLAR CVD (INSTRUMENTS) ×1
SET IRRIG TUBING LAPAROSCOPIC (IRRIGATION / IRRIGATOR) ×2 IMPLANT
SLEEVE Z-THREAD 5X100MM (TROCAR) IMPLANT
SOLUTION ANTI FOG 6CC (MISCELLANEOUS) ×2 IMPLANT
STOPCOCK K 69 2C6206 (IV SETS) ×2 IMPLANT
STRIP CLOSURE SKIN 1/4X4 (GAUZE/BANDAGES/DRESSINGS) IMPLANT
SUT VIC AB 5-0 PS2 18 (SUTURE) ×4 IMPLANT
TOWEL OR 17X26 10 PK STRL BLUE (TOWEL DISPOSABLE) ×6 IMPLANT
TRAY LAP CHOLE (CUSTOM PROCEDURE TRAY) ×2 IMPLANT
TROCAR XCEL BLUNT TIP 100MML (ENDOMECHANICALS) ×2 IMPLANT
TROCAR XCEL NON-BLD 11X100MML (ENDOMECHANICALS) ×2 IMPLANT
TROCAR Z-THREAD FIOS 11X100 BL (TROCAR) ×2 IMPLANT
TROCAR Z-THREAD FIOS 5X100MM (TROCAR) ×8 IMPLANT
TROCAR Z-THREAD SLEEVE 11X100 (TROCAR) IMPLANT
TUBING INSUFFLATION 10FT LAP (TUBING) ×2 IMPLANT
WATER STERILE IRR 1500ML POUR (IV SOLUTION) IMPLANT

## 2012-02-19 NOTE — Anesthesia Postprocedure Evaluation (Signed)
  Anesthesia Post-op Note  Patient: [name] [name]  Procedure(s) Performed: Procedure(s) (LRB): LAPAROSCOPIC CHOLECYSTECTOMY WITH INTRAOPERATIVE CHOLANGIOGRAM (N/A)  Patient Location: PACU  Anesthesia Type: General  Level of Consciousness: awake and alert   Airway and Oxygen Therapy: Patient Spontanous Breathing  Post-op Pain: mild  Post-op Assessment: Post-op Vital signs reviewed, Patient's Cardiovascular Status Stable, Respiratory Function Stable, Patent Airway and No signs of Nausea or vomiting  Post-op Vital Signs: stable  Complications: No apparent anesthesia complications

## 2012-02-19 NOTE — Progress Notes (Signed)
Subjective:  No cardiac complaints. No SOB, no CP. RUQ abd pain.    Objective:  Vital Signs in the last 24 hours: Temp:  [97.7 F (36.5 C)-100.4 F (38 C)] 97.7 F (36.5 C) (06/01 0700) Pulse Rate:  [81-127] 99  (06/01 0700) Resp:  [17-31] 20  (06/01 0700) BP: (108-161)/(65-92) 135/87 mmHg (06/01 0700) SpO2:  [93 %-100 %] 94 % (06/01 0700) Weight:  [126 kg (277 lb 12.5 oz)] 126 kg (277 lb 12.5 oz) (05/31 1806)  Intake/Output from previous day: 05/31 0701 - 06/01 0700 In: -  Out: 100 [Urine:100]   Physical Exam: General: Well developed, well nourished, in no acute distress. Head:  Normocephalic and atraumatic. Lungs: Clear to auscultation and percussion. Heart: Irreg.  No murmur, rubs or gallops.  Pulses: Pulses normal in all 4 extremities. Abdomen: soft, non-tender, positive bowel sounds. Obese Extremities: No clubbing or cyanosis. No edema. Neurologic: Alert and oriented x 3.    Lab Results:  Basename 02/19/12 0630 02/18/12 1045  WBC 13.2* 20.6*  HGB 11.8* 13.7  PLT 182 184    Basename 02/19/12 0630 02/18/12 1045  NA 134* 136  K 4.3 3.9  CL 100 99  CO2 25 23  GLUCOSE 122* 110*  BUN 17 22  CREATININE 1.12 1.32   No results found for this basename: TROPONINI:2,CK,MB:2 in the last 72 hours Hepatic Function Panel  Basename 02/18/12 1045  PROT 7.3  ALBUMIN 3.2*  AST 14  ALT 14  ALKPHOS 67  BILITOT 1.2  BILIDIR --  IBILI --   No results found for this basename: CHOL in the last 72 hours No results found for this basename: PROTIME in the last 72 hours  Imaging: US Abdomen Complete  02/18/2012  *RADIOLOGY REPORT*  Clinical Data:  Question cholecystitis.  COMPLETE ABDOMINAL ULTRASOUND  Comparison:  Acute abdominal series 02/18/2012  Findings:  Gallbladder:  There is a 2 cm gallstone in the gallbladder neck. There is gallbladder wall thickening, measuring up to 8 mm.  There is suggestion of a small amount of pericholecystic fluid.  The sonographic Murphy's  sign is positive.  Common bile duct:  Measures 4.4 mm were imaged.  Liver:  No focal lesion identified.  Within normal limits in parenchymal echogenicity.  IVC:  Appears normal.  Pancreas:  The pancreas is not well visualized, due to bowel gas in this region.  Spleen:  Normal size and echotexture without focal parenchymal abnormality.  Right Kidney:  No hydronephrosis.  Well-preserved cortex.  Normal size and parenchymal echotexture without focal abnormalities.  Left Kidney:  No hydronephrosis.  Well-preserved cortex.  Normal size and parenchymal echotexture .  Upper pole 1.9 x 1.7 x 1.7 cm cyst.  Abdominal aorta:  Limited visualization, due to bowel gas.  The proximal abdominal aorta measures 228 cm AP diameter.  The mid to distal abdominal aorta could not be visualized.  IMPRESSION:  1.  Positive study.  Sonographic findings consistent with acute cholecystitis (stone in the gallbladder neck, gallbladder wall thickening, and positive sonographic Murphy's sign). 2.  Evaluation of the pancreas and mid to distal abdominal aorta are markedly limited due to bowel gas.  Original Report Authenticated By: Britta Mccreedy, M.D.   Dg Abd Acute W/chest  02/18/2012  *RADIOLOGY REPORT*  Clinical Data: Question small bowel obstruction  ACUTE ABDOMEN SERIES (ABDOMEN 2 VIEW & CHEST 1 VIEW)  Comparison: None.  Findings: Heart, mediastinal, hilar contours are normal.  The trachea is midline.  The there is streaky atelectasis in the  left lung base.  No visible pleural effusion or pneumothorax.  No evidence of free intraperitoneal air.  Two oval radiopaque densities projecting over the right lower quadrant are likely ingested pills. The bowel gas pattern is nonobstructive.  Three similar appearing calcifications in the left pelvis are most likely phleboliths, and two of them are more inferiorly positioned than the expected location of the left ureter. The most proximal of these calcifications could potentially be a distal left  ureteral stone, but a phlebolith is favored.  IMPRESSION:  1.  Nonobstructive bowel gas pattern. 2.  No acute cardiopulmonary disease. 3.  Cannot completely exclude a distal left ureteral stone.  Left pelvic calcifications favored to be phleboliths.  Question if the patient has left flank pain.  Original Report Authenticated By: Britta Mccreedy, M.D.   Personally viewed.   Telemetry: AFIB - rate controlled 80-90 now, max 120's Personally viewed.   EKG:  AFIB   Assessment/Plan:  Active Problems:  Atrial fibrillation  Pre op risk  - agree with prior note. OK to proceed with surgery.   - Afib rate controlled. Metoprolol.   Afib  - Continue with metoprolol 50 BID  - Discussed anticoagulation with him for CVA prevention  - I take care of his wife (afib)  - Once safe from surgical perspective, I would advocate anticoagulation. (could use coumadin/or new oral agent such as Xarelto for ease of use).   Obesity  - weight loss important  WBC improved with abx.   Acute cholecystitis  - await surgery.   Will follow.    [name] [name] 02/19/2012, 7:44 AM

## 2012-02-19 NOTE — Brief Op Note (Signed)
02/18/2012 - 02/19/2012  11:27 AM  PATIENT:  [name] [name], 66 y.o., male, MRN: [id number]  PREOP DIAGNOSIS:  cholecystitis  POSTOP DIAGNOSIS:   Gangrenous cholecystitis with impacted 3 cm gallstone  PROCEDURE:   Procedure(s): LAPAROSCOPIC CHOLECYSTECTOMY WITH INTRAOPERATIVE CHOLANGIOGRAM (5 port)  SURGEON:   Ovidio Kin, M.D.  Threasa HeadsBarrie Dunker, M.D.  ANESTHESIA:   general  Gaetano Hawthorne, MD - Anesthesiologist Paris Lore, CRNA - CRNA  General  EBL:  50  ml  BLOOD ADMINISTERED: none  DRAINS: none   LOCAL MEDICATIONS USED:   25 cc 1/4% marcaine  SPECIMEN:   Gall bladder (in pieces)  COUNTS CORRECT:  YES  INDICATIONS FOR PROCEDURE:  [name] [name] is a 66 y.o. (DOB: 1946-06-07) white male whose primary care physician is Gaye Alken, MD, MD and comes for cholecystectomy.   The indications and risks of the surgery were explained to the patient.  The risks include, but are not limited to, infection, bleeding, and nerve injury.  Note dictated to:   [phone]

## 2012-02-19 NOTE — Transfer of Care (Signed)
Immediate Anesthesia Transfer of Care Note  Patient: [name] [name]  Procedure(s) Performed: Procedure(s) (LRB): LAPAROSCOPIC CHOLECYSTECTOMY WITH INTRAOPERATIVE CHOLANGIOGRAM (N/A)  Patient Location: PACU  Anesthesia Type: General  Level of Consciousness: sedated, patient cooperative and responds to stimulaton  Airway & Oxygen Therapy: Patient Spontanous Breathing and Patient connected to face mask oxgen  Post-op Assessment: Report given to PACU RN and Post -op Vital signs reviewed and stable  Post vital signs: Reviewed and stable  Complications: No apparent anesthesia complications

## 2012-02-20 LAB — CBC
HCT: 33.7 % — ABNORMAL LOW (ref 39.0–52.0)
Hemoglobin: 11 g/dL — ABNORMAL LOW (ref 13.0–17.0)
MCH: 32.6 pg (ref 26.0–34.0)
MCHC: 32.6 g/dL (ref 30.0–36.0)
MCV: 100 fL (ref 78.0–100.0)
Platelets: 193 10*3/uL (ref 150–400)
RBC: 3.37 MIL/uL — ABNORMAL LOW (ref 4.22–5.81)
RDW: 13.5 % (ref 11.5–15.5)
WBC: 8.8 10*3/uL (ref 4.0–10.5)

## 2012-02-20 MED ORDER — DOCUSATE SODIUM 100 MG PO CAPS
100.0000 mg | ORAL_CAPSULE | Freq: Every day | ORAL | Status: DC
  Administered 2012-02-20 – 2012-02-21 (×2): 100 mg via ORAL
  Filled 2012-02-20 (×2): qty 1

## 2012-02-20 MED ORDER — RIVAROXABAN 10 MG PO TABS
20.0000 mg | ORAL_TABLET | Freq: Every day | ORAL | Status: DC
  Administered 2012-02-20: 20 mg via ORAL
  Filled 2012-02-20 (×2): qty 2

## 2012-02-20 NOTE — Op Note (Signed)
NAMEMarland Kitchen  JERMELL, HOLEMAN NO.:  [id number]  MEDICAL RECORD NO.:  [id number]  LOCATION:  [facility]                         FACILITY:  [facility]  PHYSICIAN:  Sandria Bales. Ezzard Standing, M.D.  DATE OF BIRTH:  11-12-1945  DATE OF PROCEDURE:  02/19/2012                              OPERATIVE REPORT   PREOPERATIVE DIAGNOSIS:  Acute cholecystitis cholelithiasis.  POSTOPERATIVE DIAGNOSIS:  Gangrenous cholecystitis with impacted 3-cm Gallstone in neck of gall bladder.  PROCEDURE:  Laparoscopic cholecystectomy with intraoperative cholangiogram (5 port).  SURGEON:  Sandria Bales. Ezzard Standing, M.D.  FIRST ASSIST:  Abigail Miyamoto, M.D.  ANESTHESIA:  General.  ESTIMATED BLOOD LOSS:  50 mL.  DRAINS:  Drains left in were none.  INDICATION:  Mr. Lyness is a 66 year old white male, sees Dr. Juluis Rainier as his primary medical doctor.  He has had about a 3-day history of abdominal pain, presented to the [facility] Emergency room last night with evidence of acute cholecystitis.  He also had new onset of atrial fibrillation, has been seen in the cardiac standpoint by Dr. Grandville Silos and Dr. Donato Schultz.  I discussed with the patient, the indications and potential complications of gallbladder surgery.  Potential complications include, but are not limited to, bleeding, common bile duct injury, and the possibility of open surgery.  PROCEDURE IN DETAIL:  He presents to room #1 under general endotracheal anesthesia as supervised by Dr. Ronelle Nigh.  A time-out was held and surgical checklist run.    He was already on Unasyn as an antibiotic.  His abdomen was prepped with ChloraPrep and sterilely draped.  I went through an infraumbilical incision with sharp dissection carried down the abdominal cavity.  A 0-degree 10-mm laparoscope was inserted into a 12-mm Hasson trocar into the abdomen. The 12-mm Hasson trocar secured with a 0 Vicryl suture.  I ended up using 5 Ports: the Hasson, three 5-mm  ports, and one 12-mm port.  The right left lobe of liver was unremarkable.  Stomach was unremarkable.  The bowel that I could see was unremarkable, but he had a distended gangrenous gallbladder in the right upper quadrant which was encased in omentum.  The omentum was peeled away.  I dissected down to the cystic duct gallbladder junction.  He had a small anterior cystic duct artery that I clipped.  I placed a clip on the gallbladder side and shot intraoperative cholangiogram.  Intraoperative cholangiogram was shot using a cutoff Taut catheter, inserted through a 14-gauge trocar and into the side of the cut cystic duct and secured with an Endoclip.  Using approximately 18 mL of half- strength Hypaque solution as contrast.  The cholangiogram was shot under fluoroscopy. I injected contrast, which went down the cystic duct, into the common bile duct, and up the hepatic radicals.  The contrast emptied into the duodenum with obstruction. There was no filling defect.  There was no leak.  This Was a normal intraoperative cholangiogram.  The Taut catheter was then removed.  I placed 3 clips securely across the cystic duct.  I then bluntly and sharply dissected the gallbladder from the gallbladder bed.  There were edematous and gangrenous changes of the gall bladder, and the  dissection was probably about half done with blunt dissection.  He had some oozing from the gallbladder bed, but no evidence of bile leak.  I delivered the gallbladder through the umbilicus.  I did tear it into pieces to get out.  His gallstone was probably 3 to 3.5 cm in diameter.  I then reinspected the gallbladder bed.  I irrigated the abdomen with 5 L of saline.  I used cautery along the gallbladder bed which was oozy.  There was no bile leak noted.  There was no bleeding or bile leak from the triangle of Calot.  I did not think it had to be drained.    The trocars were removed in turn.  The umbilical port closed with  0 Vicryl suture.  The skin at each port was closed with 5-0 Monocryl suture.  I did infiltrate about 25 mL of 4% Marcaine at the incisions and the wounds were painted with Dermabond.  He was transferred to recovery room in good condition.  Sponge and needle count were correct at the end of the case.  He is still in atrial fibrillation.  We will follow the recommendations of Dr. Anne Fu, postop.   Sandria Bales. Ezzard Standing, M.D., FACS   DHN/MEDQ  D:  02/19/2012  T:  02/20/2012  Job:  [id number]  cc:   Dr. Caroll Rancher, MD Fax: [phone]

## 2012-02-20 NOTE — Progress Notes (Signed)
Pt has ambulated 4 times today in hallway and tolerated it well.

## 2012-02-20 NOTE — Progress Notes (Signed)
General Surgery Note  LOS: 2 days  POD# 1  Assessment/Plan: 1.  LAPAROSCOPIC CHOLECYSTECTOMY WITH INTRAOPERATIVE CHOLANGIOGRAM for gangrenous cholecystitis - 02/19/2012 - D. Sabriah Hobbins  On Unasyn.  Plan: Will keep one more day on IV antibiotics and probable discharge tomorrow.  He will need 5 more days of antibiotics on discharge.  Follow up with Dr. Anne Fu as outlined below.   2.  Atrial fibrillation  Seen by Dr. Anne Fu - his recommendations are:  - Start Xarelto 20mg  PO QD (take with dinner, largest meal). Discussed with patient. Bleeding risks discussed. Stroke risks with AFIB discussed.   - Continue metoprolol 50mg  BID   - Will see in clinic for follow up. If he does not convert on his own in the next 4 weeks of anticoagulation, will cardiovert.   - I will check ECHO in clinic.   - Will check sleep study if not already done.  3. Hypertension  4. Dyslipidemia  6. BMI 39.7  7. Disc disease and some spinal stenosis 8.  VTE proph - on Lovenox, but will stop this with the beginning of Xarelto.  Subjective:  Doing well, except soreness.  He needs to move a little more.  Wife in room. Objective:   Filed Vitals:   02/20/12 0600  BP: 128/84  Pulse: 97  Temp: 98.9 F (37.2 C)  Resp: 18     Intake/Output from previous day:  06/01 0701 - 06/02 0700 In: 1410 [I.V.:1300; IV Piggyback:110] Out: -   Intake/Output this shift:      Physical Exam:   General: Obese WM who is alert and oriented.    HEENT: Normal. Pupils equal. .   Lungs: Clear   Abdomen: Big, but soft.  BS present.   Wound: clean.   Neurologic:  Grossly intact to motor and sensory function.   Psychiatric: Has normal mood and affect. Behavior is normal   Lab Results:    Basename 02/20/12 0545 02/19/12 0630  WBC 8.8 13.2*  HGB 11.0* 11.8*  HCT 33.7* 35.0*  PLT 193 182    BMET   Basename 02/19/12 0630 02/18/12 1045  NA 134* 136  K 4.3 3.9  CL 100 99  CO2 25 23  GLUCOSE 122* 110*  BUN 17 22  CREATININE 1.12  1.32  CALCIUM 8.7 9.3    PT/INR  No results found for this basename: LABPROT:2,INR:2 in the last 72 hours  ABG  No results found for this basename: PHART:2,PCO2:2,PO2:2,HCO3:2 in the last 72 hours   Studies/Results:  Dg Cholangiogram Operative  02/19/2012  *RADIOLOGY REPORT*  Clinical Data:   Gallstones.  INTRAOPERATIVE CHOLANGIOGRAM  Comparison: Ultrasound 02/18/2012  Findings: Intraoperative spot images show normal caliber biliary system.  No evidence of retained stone or obstruction.  Free passage of contrast into the small bowel noted.  IMPRESSION: No evidence of retained stone or biliary obstruction.  These images were submitted for radiologic interpretation only. Please see the procedural report for the amount of contrast and the fluoroscopy time utilized.  Original Report Authenticated By: Cyndie Chime, M.D.   US Abdomen Complete  02/18/2012  *RADIOLOGY REPORT*  Clinical Data:  Question cholecystitis.  COMPLETE ABDOMINAL ULTRASOUND  Comparison:  Acute abdominal series 02/18/2012  Findings:  Gallbladder:  There is a 2 cm gallstone in the gallbladder neck. There is gallbladder wall thickening, measuring up to 8 mm.  There is suggestion of a small amount of pericholecystic fluid.  The sonographic Murphy's sign is positive.  Common bile duct:  Measures 4.4  mm were imaged.  Liver:  No focal lesion identified.  Within normal limits in parenchymal echogenicity.  IVC:  Appears normal.  Pancreas:  The pancreas is not well visualized, due to bowel gas in this region.  Spleen:  Normal size and echotexture without focal parenchymal abnormality.  Right Kidney:  No hydronephrosis.  Well-preserved cortex.  Normal size and parenchymal echotexture without focal abnormalities.  Left Kidney:  No hydronephrosis.  Well-preserved cortex.  Normal size and parenchymal echotexture .  Upper pole 1.9 x 1.7 x 1.7 cm cyst.  Abdominal aorta:  Limited visualization, due to bowel gas.  The proximal abdominal aorta measures 228  cm AP diameter.  The mid to distal abdominal aorta could not be visualized.  IMPRESSION:  1.  Positive study.  Sonographic findings consistent with acute cholecystitis (stone in the gallbladder neck, gallbladder wall thickening, and positive sonographic Murphy's sign). 2.  Evaluation of the pancreas and mid to distal abdominal aorta are markedly limited due to bowel gas.  Original Report Authenticated By: Britta Mccreedy, M.D.   Dg Abd Acute W/chest  02/18/2012  *RADIOLOGY REPORT*  Clinical Data: Question small bowel obstruction  ACUTE ABDOMEN SERIES (ABDOMEN 2 VIEW & CHEST 1 VIEW)  Comparison: None.  Findings: Heart, mediastinal, hilar contours are normal.  The trachea is midline.  The there is streaky atelectasis in the left lung base.  No visible pleural effusion or pneumothorax.  No evidence of free intraperitoneal air.  Two oval radiopaque densities projecting over the right lower quadrant are likely ingested pills. The bowel gas pattern is nonobstructive.  Three similar appearing calcifications in the left pelvis are most likely phleboliths, and two of them are more inferiorly positioned than the expected location of the left ureter. The most proximal of these calcifications could potentially be a distal left ureteral stone, but a phlebolith is favored.  IMPRESSION:  1.  Nonobstructive bowel gas pattern. 2.  No acute cardiopulmonary disease. 3.  Cannot completely exclude a distal left ureteral stone.  Left pelvic calcifications favored to be phleboliths.  Question if the patient has left flank pain.  Original Report Authenticated By: Britta Mccreedy, M.D.     Anti-infectives:   Anti-infectives     Start     Dose/Rate Route Frequency Ordered Stop   02/18/12 2130  Ampicillin-Sulbactam (UNASYN) 3 g in sodium chloride 0.9 % 100 mL IVPB       3 g 100 mL/hr over 60 Minutes Intravenous Every 6 hours 02/18/12 1626     02/18/12 1630   piperacillin-tazobactam (ZOSYN) IVPB 3.375 g  Status:  Discontinued         3.375 g 12.5 mL/hr over 240 Minutes Intravenous 3 times per day 02/18/12 1624 02/18/12 1626   02/18/12 1500  Ampicillin-Sulbactam (UNASYN) 3 g in sodium chloride 0.9 % 100 mL IVPB       3 g 100 mL/hr over 60 Minutes Intravenous  Once 02/18/12 1451 02/19/12 0955          Ovidio Kin, MD, FACS Pager: [phone],   [facility] Office: [phone] 02/20/2012

## 2012-02-20 NOTE — Progress Notes (Signed)
Subjective:  Post op. Mild abdominal shooting pain. Improved.  No SOB, no CP, no palpitations. He does not feel symptoms of afib.   Objective:  Vital Signs in the last 24 hours: Temp:  [98.7 F (37.1 C)-99.3 F (37.4 C)] 98.9 F (37.2 C) (06/02 0600) Pulse Rate:  [95-102] 97  (06/02 0600) Resp:  [12-18] 18  (06/02 0600) BP: (104-128)/(66-84) 128/84 mmHg (06/02 0600) SpO2:  [92 %-99 %] 92 % (06/02 0600)  Intake/Output from previous day: 06/01 0701 - 06/02 0700 In: 1410 [I.V.:1300; IV Piggyback:110] Out: -    Physical Exam: General: Well developed, well nourished, in no acute distress. Head:  Normocephalic and atraumatic. Lungs: Clear to auscultation and percussion. Heart: Irreg normal rate.  No murmur, rubs or gallops.  Pulses: Pulses normal in all 4 extremities. Abdomen: soft, non-tender, positive bowel sounds.Obese Extremities: No clubbing or cyanosis. No edema. Neurologic: Alert and oriented x 3.    Lab Results:  Basename 02/20/12 0545 02/19/12 0630  WBC 8.8 13.2*  HGB 11.0* 11.8*  PLT 193 182    Basename 02/19/12 0630 02/18/12 1045  NA 134* 136  K 4.3 3.9  CL 100 99  CO2 25 23  GLUCOSE 122* 110*  BUN 17 22  CREATININE 1.12 1.32   No results found for this basename: TROPONINI:2,CK,MB:2 in the last 72 hours Hepatic Function Panel  Basename 02/18/12 1045  PROT 7.3  ALBUMIN 3.2*  AST 14  ALT 14  ALKPHOS 67  BILITOT 1.2  BILIDIR --  IBILI --   Telemetry: AFIB HR 90's Personally viewed.   Assessment/Plan:  Active Problems:  Atrial fibrillation  - OK to discharge from my standpoint.  - Start Xarelto 20mg  PO QD (take with dinner, largest meal). Discussed with patient. Bleeding risks discussed. Stroke risks with AFIB discussed.   - Continue metoprolol 50mg  BID  - Will see in clinic for follow up. If he does not convert on his own in the next 4 weeks of anticoagulation, will cardiovert.   - I will check ECHO in clinic.   - Will check sleep study if not  already done.   Obesity  -weight loss important  HTN  - controlled  Acute chole  - Per Dr. Allene Pyo team-improved.  Please let me know if any questions. Discussed plan with Dr. Magnus Ivan.   Joydan Gretzinger 02/20/2012, 7:52 AM

## 2012-02-21 ENCOUNTER — Encounter (HOSPITAL_COMMUNITY): Payer: Self-pay | Admitting: Surgery

## 2012-02-21 MED ORDER — DSS 100 MG PO CAPS: 100.0000 mg | ORAL_CAPSULE | Freq: Every day | ORAL | Status: AC

## 2012-02-21 MED ORDER — AMOXICILLIN-POT CLAVULANATE 875-125 MG PO TABS
1.0000 | ORAL_TABLET | Freq: Two times a day (BID) | ORAL | Status: DC
  Filled 2012-02-21 (×2): qty 1

## 2012-02-21 MED ORDER — HYDROCODONE-ACETAMINOPHEN 5-325 MG PO TABS: 1.0000 | ORAL_TABLET | ORAL | Status: AC | PRN

## 2012-02-21 MED ORDER — ACETAMINOPHEN 325 MG PO TABS: 650.0000 mg | ORAL_TABLET | Freq: Four times a day (QID) | ORAL | Status: AC | PRN

## 2012-02-21 MED ORDER — AMOXICILLIN-POT CLAVULANATE 875-125 MG PO TABS
1.0000 | ORAL_TABLET | Freq: Two times a day (BID) | ORAL | Status: DC
  Filled 2012-02-21: qty 1

## 2012-02-21 MED ORDER — AMOXICILLIN-POT CLAVULANATE 875-125 MG PO TABS: 1.0000 | ORAL_TABLET | Freq: Two times a day (BID) | ORAL | Status: AC

## 2012-02-21 MED ORDER — RIVAROXABAN 20 MG PO TABS: 20.0000 mg | ORAL_TABLET | Freq: Every day | ORAL | Status: DC

## 2012-02-21 MED ORDER — METOPROLOL TARTRATE 50 MG PO TABS: 50.0000 mg | ORAL_TABLET | Freq: Two times a day (BID) | ORAL | Status: DC

## 2012-02-21 NOTE — Plan of Care (Signed)
Problem: Discharge Progression Outcomes Goal: Complications resolved/controlled Outcome: Adequate for Discharge Pt to follw up with Dr r/t A fib.

## 2012-02-21 NOTE — Progress Notes (Signed)
CARE MANAGEMENT NOTE 02/21/2012  Patient:  [name] [name], [name] [name]   Account Number:  [id number]  Date Initiated:  02/21/2012  Documentation initiated by:  Daivik Overley  Subjective/Objective Assessment:   pt with a.fib and gangreneous gall bladder taken to or on [id number] and lap chole done pt had a.fib post op now controlled     Action/Plan:   dc expected on [id number] lives at home with wife   Anticipated DC Date:  02/21/2012   Anticipated DC Plan:  HOME/SELF CARE  In-house referral  NA      DC Planning Services  NA      [facility] Choice  NA   Choice offered to / List presented to:  NA   DME arranged  NA  NA      DME agency  NA     HH arranged  NA      HH agency  NA   Status of service:  In process, will continue to follow Medicare Important Message given?  NA - LOS <3 / Initial given by admissions (If response is "NO", the following Medicare IM given date fields will be blank) Date Medicare IM given:   Date Additional Medicare IM given:    Discharge Disposition:    Per UR Regulation:  Reviewed for med. necessity/level of care/duration of stay  If discussed at Long Length of Stay Meetings, dates discussed:    Comments:  [id number]/Simmie Camerer Stark Jock, BSN, CCM: [phone] Case Management Chart review for utilization review. No discharge need present at time of this review.

## 2012-02-21 NOTE — Progress Notes (Signed)
2 Days Post-Op  Subjective: Feels good and ready to go home.    Objective: Vital signs in last 24 hours: Temp:  [98.4 F (36.9 C)-98.6 F (37 C)] 98.6 F (37 C) (06/03 0424) Pulse Rate:  [89-97] 89  (06/03 0424) Resp:  [16-18] 16  (06/03 0424) BP: (111-127)/(73-85) 111/73 mmHg (06/03 0424) SpO2:  [93 %-95 %] 93 % (06/03 0424) Last BM Date: 02/19/12  Afebrile, VSS, no labs, No BM so far, some constipation in past.  Intake/Output from previous day: 06/02 0701 - 06/03 0700 In: 990 [P.O.:240; I.V.:550; IV Piggyback:200] Out: -  Intake/Output this shift: Total I/O In: 240 [P.O.:240] Out: -   General appearance: alert, cooperative and no distress Resp: clear to auscultation bilaterally GI: soft, tender, +BS, taking PO's well. incision looks good  Lab Results:   Basename 02/20/12 0545 02/19/12 0630  WBC 8.8 13.2*  HGB 11.0* 11.8*  HCT 33.7* 35.0*  PLT 193 182    BMET  Basename 02/19/12 0630 02/18/12 1045  NA 134* 136  K 4.3 3.9  CL 100 99  CO2 25 23  GLUCOSE 122* 110*  BUN 17 22  CREATININE 1.12 1.32  CALCIUM 8.7 9.3   PT/INR No results found for this basename: LABPROT:2,INR:2 in the last 72 hours   Lab 02/18/12 1045  AST 14  ALT 14  ALKPHOS 67  BILITOT 1.2  PROT 7.3  ALBUMIN 3.2*     Lipase     Component Value Date/Time   LIPASE 16 02/18/2012 1045     Studies/Results: Dg Cholangiogram Operative  02/19/2012  *RADIOLOGY REPORT*  Clinical Data:   Gallstones.  INTRAOPERATIVE CHOLANGIOGRAM  Comparison: Ultrasound 02/18/2012  Findings: Intraoperative spot images show normal caliber biliary system.  No evidence of retained stone or obstruction.  Free passage of contrast into the small bowel noted.  IMPRESSION: No evidence of retained stone or biliary obstruction.  These images were submitted for radiologic interpretation only. Please see the procedural report for the amount of contrast and the fluoroscopy time utilized.  Original Report Authenticated By:  Cyndie Chime, M.D.    Medications:    . ampicillin-sulbactam (UNASYN) IV  3 g Intravenous Q6H  . docusate sodium  100 mg Oral Daily  . metoprolol tartrate  50 mg Oral BID  . pantoprazole (PROTONIX) IV  40 mg Intravenous QHS  . rivaroxaban  20 mg Oral Q supper    Assessment/Plan LAPAROSCOPIC CHOLECYSTECTOMY WITH INTRAOPERATIVE CHOLANGIOGRAM for gangrenous cholecystitis - 02/19/2012 - D. Newman  On Unasyn.  Plan: Will keep one more day on IV antibiotics and probable discharge tomorrow. He will need 5 more days of antibiotics on discharge. Follow up with Dr. Anne Fu as outlined below.  Atrial fibrillation  Seen by Dr. Anne Fu - his recommendations are:  - Start Xarelto 20mg  PO QD Hypertension   Dyslipidemia   BMI 39.7   Disc disease and some spinal stenosis   Plan:  Home today 5 more days of oral antibiotics.       LOS: 3 days    [name] [name] 02/21/2012

## 2012-02-21 NOTE — Discharge Instructions (Signed)
Atrial Fibrillation Your caregiver has diagnosed you with atrial fibrillation (AFib). The heart normally beats very regularly; AFib is a type of irregular heartbeat. The heart rate may be faster or slower than normal. This can prevent your heart from pumping as well as it should. AFib can be constant (chronic) or intermittent (paroxysmal). CAUSES  Atrial fibrillation may be caused by:  Heart disease, including heart attack, coronary artery disease, heart failure, diseases of the heart valves, and others.   Blood clot in the lungs (pulmonary embolism).   Pneumonia or other infections.   Chronic lung disease.   Thyroid disease.   Toxins. These include alcohol, some medications (such as decongestant medications or diet pills), and caffeine.  In some people, no cause for AFib can be found. This is referred to as Lone Atrial Fibrillation. SYMPTOMS   Palpitations or a fluttering in your chest.   A vague sense of chest discomfort.   Shortness of breath.   Sudden onset of lightheadedness or weakness.  Sometimes, the first sign of AFib can be a complication of the condition. This could be a stroke or heart failure. DIAGNOSIS  Your description of your condition may make your caregiver suspicious of atrial fibrillation. Your caregiver will examine your pulse to determine if fibrillation is present. An EKG (electrocardiogram) will confirm the diagnosis. Further testing may help determine what caused you to have atrial fibrillation. This may include chest x-ray, echocardiogram, blood tests, or CT scans. PREVENTION  If you have previously had atrial fibrillation, your caregiver may advise you to avoid substances known to cause the condition (such as stimulant medications, and possibly caffeine or alcohol). You may be advised to use medications to prevent recurrence. Proper treatment of any underlying condition is important to help prevent recurrence. PROGNOSIS  Atrial fibrillation does tend to  become a chronic condition over time. It can cause significant complications (see below). Atrial fibrillation is not usually immediately life-threatening, but it can shorten your life expectancy. This seems to be worse in women. If you have lone atrial fibrillation and are under 60 years old, the risk of complications is very low, and life expectancy is not shortened. RISKS AND COMPLICATIONS  Complications of atrial fibrillation can include stroke, chest pain, and heart failure. Your caregiver will recommend treatments for the atrial fibrillation, as well as for any underlying conditions, to help minimize risk of complications. TREATMENT  Treatment for AFib is divided into several categories:  Treatment of any underlying condition.   Converting you out of AFib into a regular (sinus) rhythm.   Controlling rapid heart rate.   Prevention of blood clots and stroke.  Medications and procedures are available to convert your atrial fibrillation to sinus rhythm. However, recent studies have shown that this may not offer you any advantage, and cardiac experts are continuing research and debate on this topic. More important is controlling your rapid heartbeat. The rapid heartbeat causes more symptoms, and places strain on your heart. Your caregiver will advise you on the use of medications that can control your heart rate. Atrial fibrillation is a strong stroke risk. You can lessen this risk by taking blood thinning medications such as Coumadin (warfarin), or sometimes aspirin. These medications need close monitoring by your caregiver. Over-medication can cause bleeding. Too little medication may not protect against stroke. HOME CARE INSTRUCTIONS   If your caregiver prescribed medicine to make your heartbeat more normally, take as directed.   If blood thinners were prescribed by your caregiver, take EXACTLY as directed.     Perform blood tests EXACTLY as directed.   Quit smoking. Smoking increases your  cardiac and lung (pulmonary) risks.   DO NOT drink alcohol.   DO NOT drink caffeinated drinks (e.g. coffee, soda, chocolate, and leaf teas). You may drink decaffeinated coffee, soda or tea.   If you are overweight, you should choose a reduced calorie diet to lose weight. Please see a registered dietitian if you need more information about healthy weight loss. DO NOT USE DIET PILLS as they may aggravate heart problems.   If you have other heart problems that are causing AFib, you may need to eat a low salt, fat, and cholesterol diet. Your caregiver will tell you if this is necessary.   Exercise every day to improve your physical fitness. Stay active unless advised otherwise.   If your caregiver has given you a follow-up appointment, it is very important to keep that appointment. Not keeping the appointment could result in heart failure or stroke. If there is any problem keeping the appointment, you must call back to this facility for assistance.  SEEK MEDICAL CARE IF:  You notice a change in the rate, rhythm or strength of your heartbeat.   You develop an infection or any other change in your overall health status.  SEEK IMMEDIATE MEDICAL CARE IF:   You develop chest pain, abdominal pain, sweating, weakness or feel sick to your stomach (nausea).   You develop shortness of breath.   You develop swollen feet and ankles.   You develop dizziness, numbness, or weakness of your face or limbs, or any change in vision or speech.  MAKE SURE YOU:   Understand these instructions.   Will watch your condition.   Will get help right away if you are not doing well or get worse.  Document Released: 09/06/2005 [facility] ______[facility] LAPAROSCOPIC SURGERY: POST OP INSTRUCTIONS Always review your discharge instruction sheet given to you by the facility where your surgery was performed. IF YOU HAVE DISABILITY OR FAMILY LEAVE FORMS, YOU MUST BRING THEM TO THE OFFICE FOR  PROCESSING.   DO NOT GIVE THEM TO YOUR DOCTOR.  1. A prescription for pain medication may be given to you upon discharge.  Take your pain medication as prescribed, if needed.  If narcotic pain medicine is not needed, then you may take acetaminophen (Tylenol) or ibuprofen (Advil) as needed. 2. Take your usually prescribed medications unless otherwise directed. 3. If you need a refill on your pain medication, please contact your pharmacy.  They will contact our office to request authorization. Prescriptions will not be filled after 5pm or on week-ends. 4. You should follow a light diet the first few days after arrival home, such as soup and crackers, etc.  Be sure to include lots of fluids daily. 5. Most patients will experience some swelling and bruising in the area of the incisions.  Ice packs will help.  Swelling and bruising can take several days to resolve.  6. It is common to experience some constipation if taking pain medication after surgery.  Increasing fluid intake and taking a stool softener (such as Colace) will usually help or prevent this problem from occurring.  A mild laxative (Milk of Magnesia or Miralax) should be taken according to package instructions if there are no bowel movements after 48 hours. 7. Unless discharge instructions indicate otherwise, you may remove your bandages 24-48 hours after surgery, and you may shower at that time.  You may have steri-strips (small skin tapes) in place  directly over the incision.  These strips should be left on the skin for 7-10 days.  If your surgeon used skin glue on the incision, you may shower in 24 hours.  The glue will flake off over the next 2-3 weeks.  Any sutures or staples will be removed at the office during your follow-up visit. 8. ACTIVITIES:  You may resume regular (light) daily activities beginning the next day--such as daily self-care, walking, climbing stairs--gradually increasing activities as tolerated.  You may have sexual  intercourse when it is comfortable.  Refrain from any heavy lifting or straining until approved by your doctor. a. You may drive when you are no longer taking prescription pain medication, you can comfortably wear a seatbelt, and you can safely maneuver your car and apply brakes. b. RETURN TO WORK:  __________________________________________________________ 9. You should see your doctor in the office for a follow-up appointment approximately 2-3 weeks after your surgery.  Make sure that you call for this appointment within a day or two after you arrive home to insure a convenient appointment time. 10. OTHER INSTRUCTIONS: __________________________________________________________________________________________________________________________ __________________________________________________________________________________________________________________________ WHEN TO CALL YOUR DOCTOR: 1. Fever over 101.0 2. Inability to urinate 3. Continued bleeding from incision. 4. Increased pain, redness, or drainage from the incision. 5. Increasing abdominal pain  The clinic staff is available to answer your questions during regular business hours.  Please don't hesitate to call and ask to speak to one of the nurses for clinical concerns.  If you have a medical emergency, go to the nearest emergency room or call 911.  A surgeon from [facility] is always on call at the hospital. [address], [address], [place], [place]  [address] ? [address] [address], [place], [place]   [address] [phone] ? [phone] ? FAX [phone] Web site: [email or web address] Cholecystitis  Cholecystitis is swelling and irritation (inflammation) of your gallbladder. This often happens when gallstones or sludge build up in the gallbladder. Treatment is needed right away. HOME CARE Home care depends on how you were treated. In general:  If you were given antibiotic medicine, take it as told. Finish the  medicine even if you start to feel better.   Only take medicines as told by your doctor.   Eat low-fat foods until your next doctor visit.   Keep all doctor visits as told.  GET HELP RIGHT AWAY IF:  You have more pain and medicine does not help.   Your pain moves to a different part of your belly (abdomen) or to your back.   You have a fever.   You feel sick to your stomach (nauseous).   You throw up (vomit).  MAKE SURE YOU:  Understand these instructions.   Will watch your condition.   Will get help right away if you are not doing well or get worse.  Document Released: 08/26/2011 Document Reviewed: 08/24/2011 [facility] Patient Information 2012 [place], [place] Revised: 08/26/2011 Document Reviewed: 04/10/2008 [facility] Patient Information 2012 [place], [place].

## 2012-02-21 NOTE — Plan of Care (Signed)
Problem: Phase I Progression Outcomes Goal: Sutures/staples intact Outcome: Completed/Met Date Met:  02/21/12 dermabond skin glue

## 2012-02-21 NOTE — Discharge Summary (Signed)
Physician Discharge Summary  Patient ID: [name] [name] MRN: [id number] DOB/AGE: 68-Apr-1947 68 y.o.  Admit date: 02/18/2012 Discharge date: 02/21/2012 Primary CAR: Juluis Rainier Cardiology : Donato Schultz Admission Diagnoses:  1. Acute cholecystitis, cholelithiasis.  Will need lap chole.  2. Dehydration  3. Hypertension  4. Dyslipidemia  6. BMI 39.7  7. Disc disease and some spinal stenosis  8. New onset atrial fibrillation.  Discharge Diagnoses: Gangrenous cholecystitis with impacted 3 cm gallstone Newly detected atrial fibrillation. CCS SAF score 0, CHADS2VASc score 2 Dehydration Hypertension  BMI 39.7 Disc disease and spinal stenosis   Active Problems:  Atrial fibrillation   PROCEDURES: LAPAROSCOPIC CHOLECYSTECTOMY WITH INTRAOPERATIVE CHOLANGIOGRAM (5 port) 02/19/12 Dr. Ezzard Standing.   Hospital Course: Patient is a 66 year old gentleman who was in his normal state of health until Tuesday, 02/15/2012. He will get morning with abdominal pain on the right side. He went to the bathroom then developed nausea and vomiting. He's had ongoing episodes of abdominal pain nausea and vomiting since Tuesday. His wife came back to check on him this morning and he was confused. He was seen by Dr. Zachery Dauer office found to have an acute abdomen, dehydration and proteinuria. He was sent to the emergency room and [facility].  Workup here shows a white count of 20,600. Creatinine of 1.3 to BUN of 22. Three-way abdomen was normal except for possible left ureteral stone. Abdominal ultrasound shows a 2 cm gallstone in the neck of the gallbladder gallbladder wall thickening measuring up to 8 mm. There is a suggestion of some pericholecystic fluid and positive Murphy sign. Common bile duct was 4.4 mm. This is consistent with acute cholecystitis. We are asked to see the patient. He's been started on IV Unasyn by the emergency room physician.  He was admitted and seen by Cardiology and placed on a  betablocker. He was cleared for surgery on 02/19/12.  He has done well with that, eating, no BM so far. His telemetry shows Afib, with controlled HR, he was started on Xarelto yesterday.  He is doing well Today and we plan to send him home.  Cardiology will follow up in 2-3 weeks.  He will follow up with DR. Newman in about 2 weeks. Condition on D/C: improved.   Disposition: Final discharge disposition not confirmed   Medication List  As of 02/21/2012 10:33 AM   TAKE these medications         acetaminophen 325 MG tablet   Commonly known as: TYLENOL   Take 2 tablets (650 mg total) by mouth every 6 (six) hours as needed (or Temp > 100).      amoxicillin-clavulanate 875-125 MG per tablet   Commonly known as: AUGMENTIN   Take 1 tablet by mouth every 12 (twelve) hours.      DSS 100 MG Caps   Take 100 mg by mouth daily.      fish oil-omega-3 fatty acids 1000 MG capsule   Take 4 g by mouth daily.      HYDROcodone-acetaminophen 5-325 MG per tablet   Commonly known as: NORCO   Take 1-2 tablets by mouth every 4 (four) hours as needed.      lisinopril 10 MG tablet   Commonly known as: PRINIVIL,ZESTRIL   Take 10 mg by mouth daily.      metoprolol 50 MG tablet   Commonly known as: LOPRESSOR   Take 1 tablet (50 mg total) by mouth 2 (two) times daily.      omeprazole 20 MG capsule  Commonly known as: PRILOSEC   Take 20 mg by mouth daily.      Rivaroxaban 20 MG Tabs   Take 20 mg by mouth daily with supper.           Follow-up Information    Follow up with [facility], MD.   Contact information:   [facility] [address] [place] [address] [phone]       Follow up with Donato Schultz, MD. Schedule an appointment as soon as possible for a visit in 2 weeks. (Call for problems with your heart rate and anticoagulation .)    Contact information:   [address] [place] [address] [phone]           Signed: Sherrie George 02/21/2012, 10:33 AM

## 2012-02-24 ENCOUNTER — Telehealth (INDEPENDENT_AMBULATORY_CARE_PROVIDER_SITE_OTHER): Payer: Self-pay

## 2012-02-24 NOTE — Telephone Encounter (Signed)
I called the pt and gave him an appointment for 6/21.  Dr Ezzard Standing does not have Monday clinics.

## 2012-02-24 NOTE — Telephone Encounter (Signed)
Message copied by Ivory Broad on Thu Feb 24, 2012 12:32 PM ------      Message from: Joanette Gula      Created: Thu Feb 24, 2012 11:05 AM       This pt surgery was done by Dr Ezzard Standing. I will forward it to Yaeli Hartung. Thanks Arline Asp      ----- Message -----         From: Zacarias Pontes         Sent: 02/24/2012   9:18 AM           To: Joanette Gula, LPN            Pt needs 2-3 wk po apt for Gb sx..he preferes a Monday so his wife can come,pls call him at [phone]

## 2012-02-25 LAB — CULTURE, BLOOD (ROUTINE X 2)
Culture  Setup Time: 201306010150
Culture: NO GROWTH

## 2012-03-10 ENCOUNTER — Ambulatory Visit (INDEPENDENT_AMBULATORY_CARE_PROVIDER_SITE_OTHER): Payer: Medicare Other | Admitting: Surgery

## 2012-03-10 ENCOUNTER — Encounter (INDEPENDENT_AMBULATORY_CARE_PROVIDER_SITE_OTHER): Payer: Self-pay | Admitting: Surgery

## 2012-03-10 VITALS — BP 122/78 | HR 68 | Temp 98.1°F | Resp 18 | Ht 69.0 in | Wt 270.0 lb

## 2012-03-10 DIAGNOSIS — Z9049 Acquired absence of other specified parts of digestive tract: Secondary | ICD-10-CM | POA: Insufficient documentation

## 2012-03-10 DIAGNOSIS — Z9089 Acquired absence of other organs: Secondary | ICD-10-CM

## 2012-03-10 NOTE — Progress Notes (Signed)
[facility]  Ovidio Kin, MD,  FACS [address]  [address] [place], [place]    [address] Phone:  [phone] FAX:  [phone]   Re:   [name] [name] DOB:   1945-10-31 MRN:   [id number]  ASSESSMENT AND PLAN: 1. LAPAROSCOPIC CHOLECYSTECTOMY WITH INTRAOPERATIVE CHOLANGIOGRAM for gangrenous cholecystitis - 02/19/2012 - D. [employer] good.  Return appt prn.  2. Atrial fibrillation   Seen by Dr. Ane Payment Xarelto, but too expensive.  So he is now on Coumadin.   On metoprolol.  Echo is set up in the next week.  He is not excited about cardioversion.  We talked about a sleep apnea test. 3. Hypertension  4. Dyslipidemia  5. BMI 39.7  6. Disc disease and some spinal stenosis  HISTORY OF PRESENT ILLNESS: Chief Complaint  Patient presents with  . Routine Post Op    PO GB   [name] [name] is a 66 y.o. (DOB: Jan 28, 1946)  white male who is a patient of Gaye Alken, MD and comes to me today for follow up of gall bladder surgery.  He comes with his wife.  He is post op lap chole.  He has done well from the gall bladder.  He is working on his heart.  PHYSICAL EXAM: BP 122/78  Pulse 68  Temp 98.1 F (36.7 C) (Temporal)  Resp 18  Ht 5\' 9"  (1.753 m)  Wt 270 lb (122.471 kg)  BMI 39.87 kg/m2  Abdomen:  Wounds look good. BS normal.  DATA REVIEWED: Path to patient.  Ovidio Kin, MD, FACS Office:  [phone]

## 2013-02-01 IMAGING — RF DG CHOLANGIOGRAM OPERATIVE
1 series · 12 of 12 positions shown · non-contrast
Comparison: Ultrasound 02/18/2012

CLINICAL DATA: Gallstones.

INTRAOPERATIVE CHOLANGIOGRAM

[Series 1: run · 3 acquisitions, 12 frames shown]
[im 1/3]
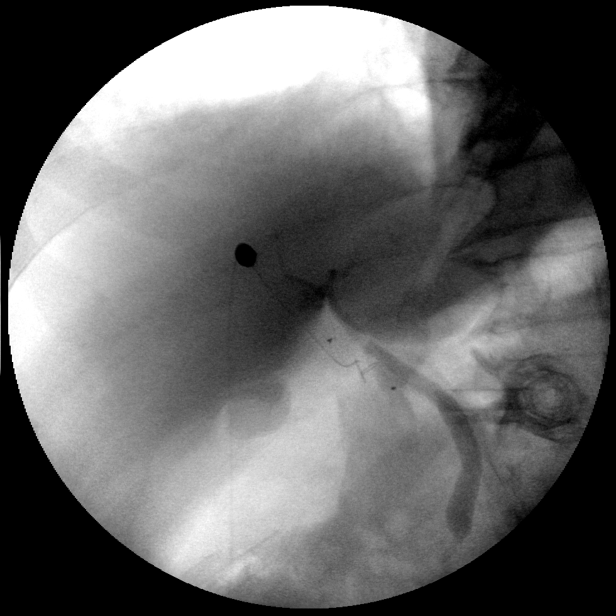
[im 1/3]
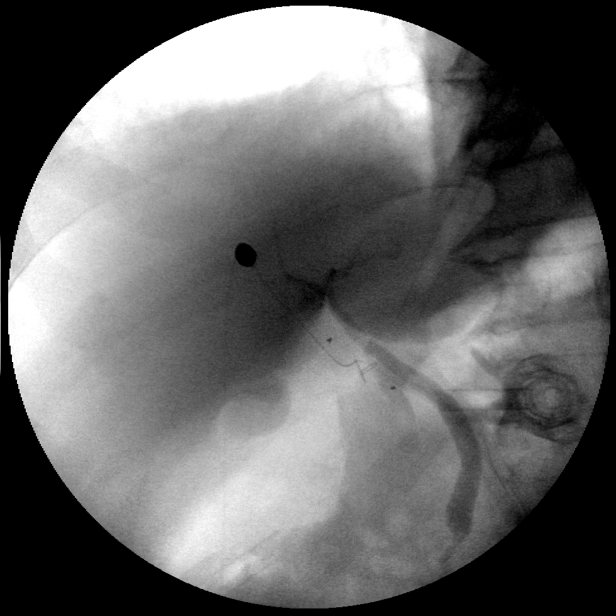
[im 1/3]
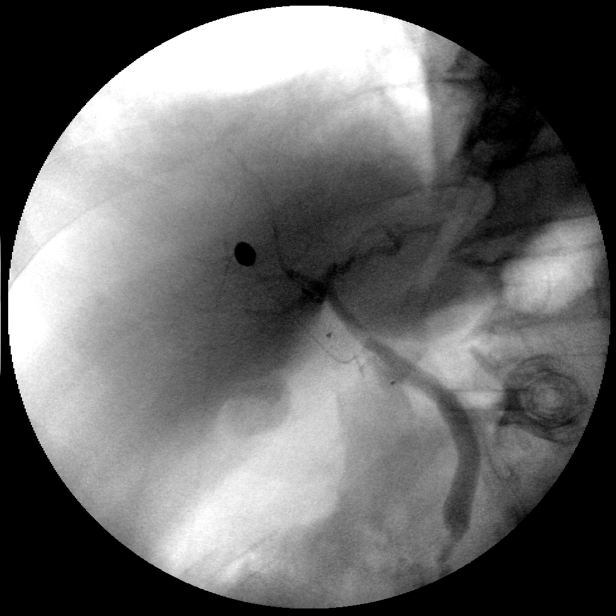
[im 1/3]
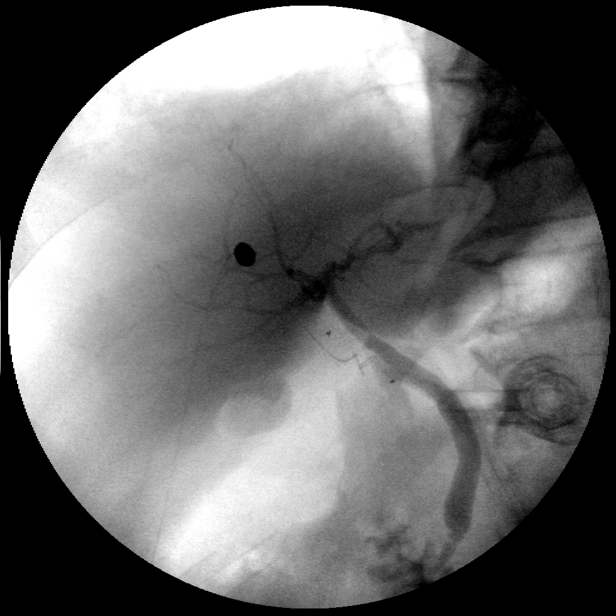
[im 2/3]
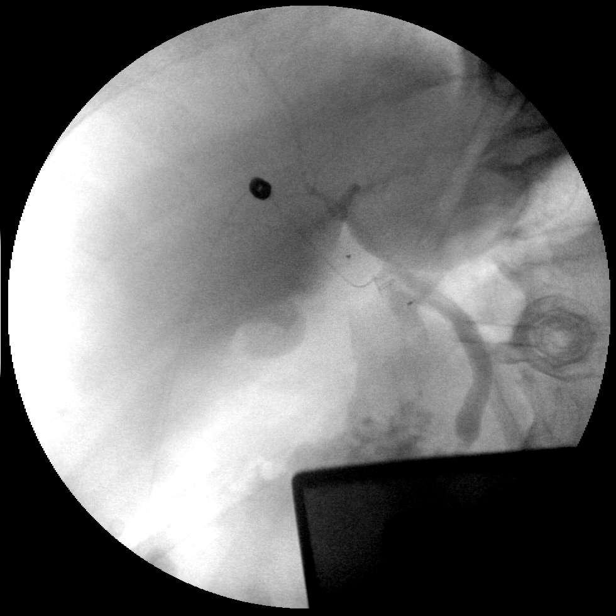
[im 2/3]
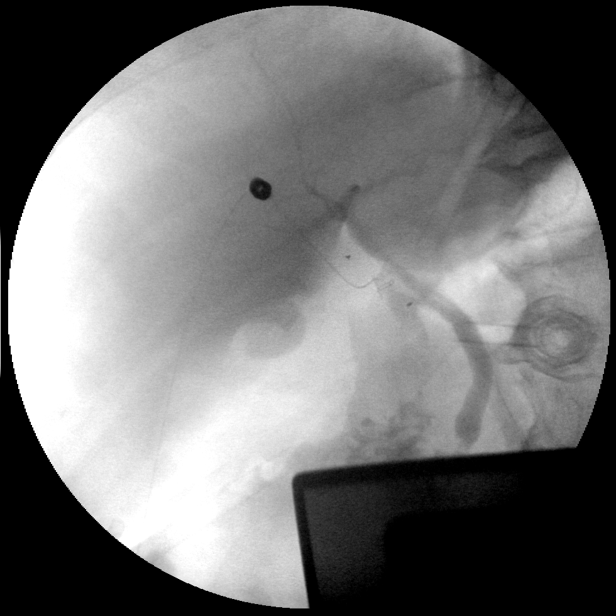
[im 2/3]
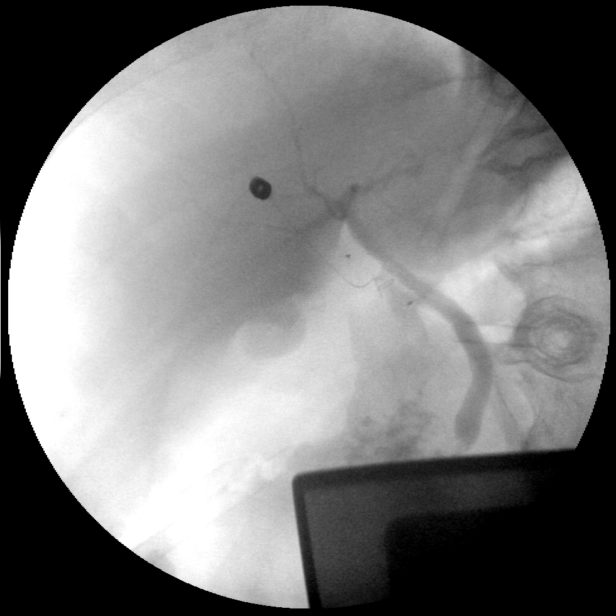
[im 2/3]
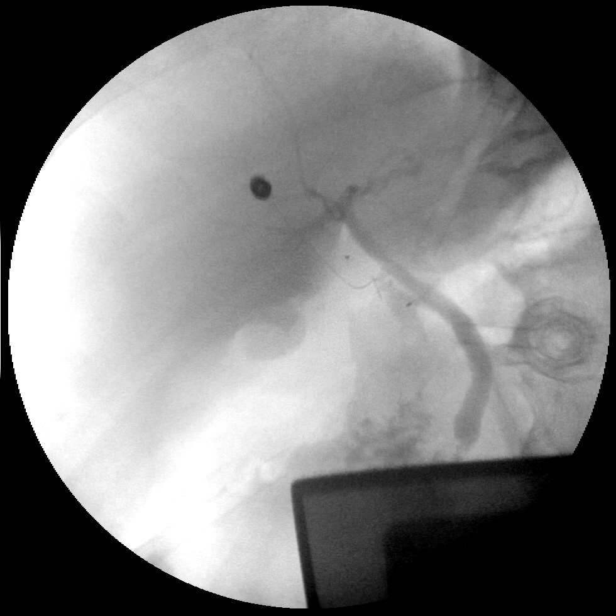
[im 3/3]
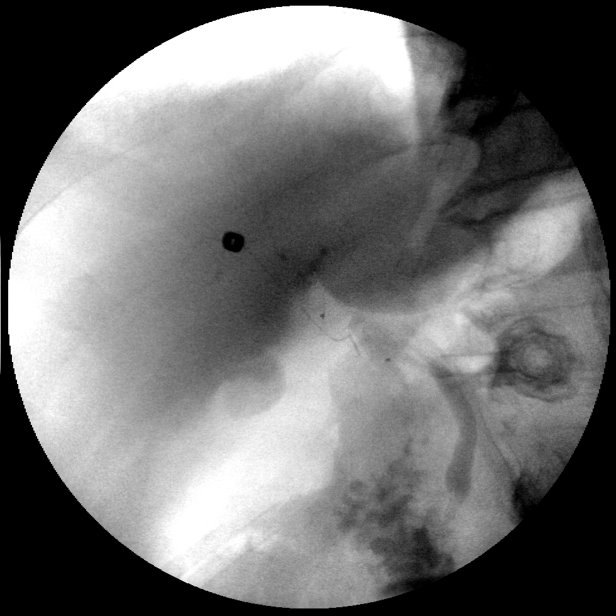
[im 3/3]
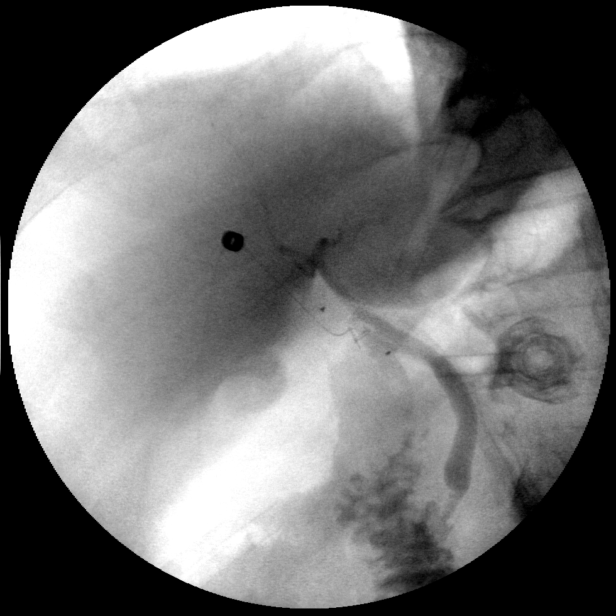
[im 3/3]
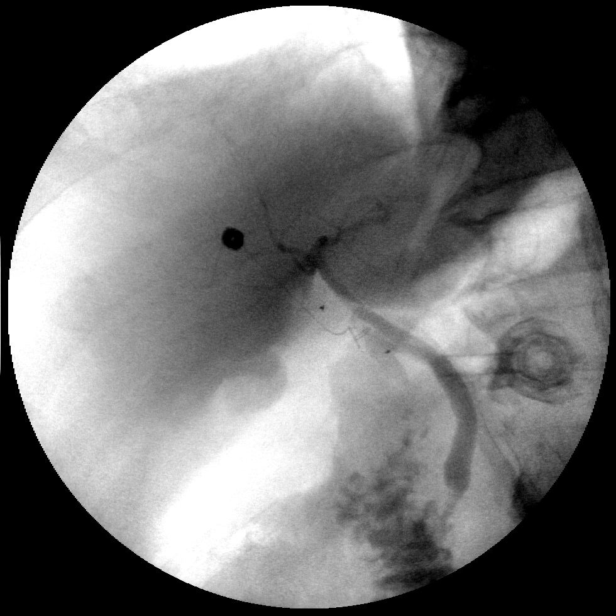
[im 3/3]
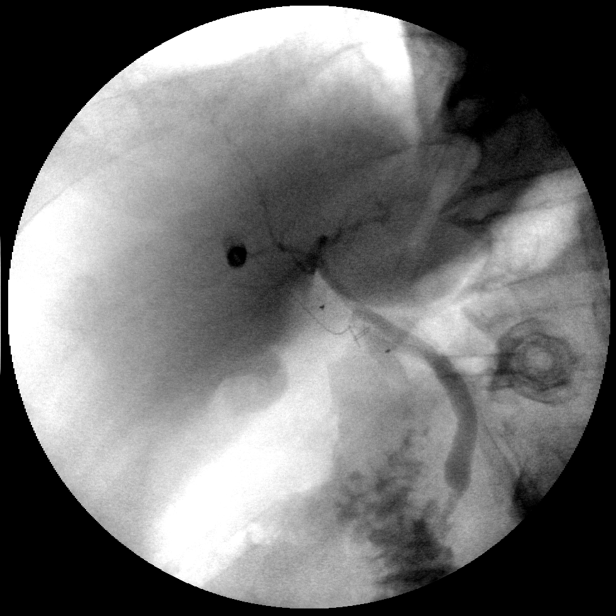

[12 of 12 positions shown; findings below may reference images not displayed]

FINDINGS: Intraoperative spot images show normal caliber biliary
system.  No evidence of retained stone or obstruction.  Free
passage of contrast into the small bowel noted.
IMPRESSION: No evidence of retained stone or biliary obstruction.

These images were submitted for radiologic interpretation only.
Please see the procedural report for the amount of contrast and the
fluoroscopy time utilized.

## 2013-06-29 ENCOUNTER — Ambulatory Visit (INDEPENDENT_AMBULATORY_CARE_PROVIDER_SITE_OTHER): Payer: Medicare Other | Admitting: Pharmacist

## 2013-06-29 DIAGNOSIS — I4891 Unspecified atrial fibrillation: Secondary | ICD-10-CM

## 2013-06-29 LAB — POCT INR: INR: 2.5

## 2013-07-27 ENCOUNTER — Ambulatory Visit (INDEPENDENT_AMBULATORY_CARE_PROVIDER_SITE_OTHER): Payer: Medicare Other | Admitting: Pharmacist

## 2013-07-27 DIAGNOSIS — I4891 Unspecified atrial fibrillation: Secondary | ICD-10-CM

## 2013-08-21 ENCOUNTER — Ambulatory Visit (INDEPENDENT_AMBULATORY_CARE_PROVIDER_SITE_OTHER): Payer: Medicare Other | Admitting: Pharmacist

## 2013-08-21 DIAGNOSIS — I4891 Unspecified atrial fibrillation: Secondary | ICD-10-CM

## 2013-09-26 ENCOUNTER — Ambulatory Visit (INDEPENDENT_AMBULATORY_CARE_PROVIDER_SITE_OTHER): Payer: Medicare Other | Admitting: Pharmacist

## 2013-09-26 DIAGNOSIS — I4891 Unspecified atrial fibrillation: Secondary | ICD-10-CM

## 2013-09-26 LAB — POCT INR: INR: 2.4

## 2013-10-24 ENCOUNTER — Ambulatory Visit (INDEPENDENT_AMBULATORY_CARE_PROVIDER_SITE_OTHER): Payer: Medicare Other | Admitting: Pharmacist

## 2013-10-24 DIAGNOSIS — Z5181 Encounter for therapeutic drug level monitoring: Secondary | ICD-10-CM

## 2013-10-24 DIAGNOSIS — I4891 Unspecified atrial fibrillation: Secondary | ICD-10-CM

## 2013-10-24 LAB — POCT INR: INR: 2.4

## 2013-10-31 ENCOUNTER — Encounter: Payer: Self-pay | Admitting: Cardiology

## 2013-10-31 ENCOUNTER — Ambulatory Visit (INDEPENDENT_AMBULATORY_CARE_PROVIDER_SITE_OTHER): Payer: Medicare Other | Admitting: Cardiology

## 2013-10-31 VITALS — BP 136/82 | HR 87 | Ht 69.0 in | Wt 305.0 lb

## 2013-10-31 DIAGNOSIS — I4891 Unspecified atrial fibrillation: Secondary | ICD-10-CM

## 2013-10-31 DIAGNOSIS — I10 Essential (primary) hypertension: Secondary | ICD-10-CM | POA: Insufficient documentation

## 2013-10-31 DIAGNOSIS — E785 Hyperlipidemia, unspecified: Secondary | ICD-10-CM

## 2013-10-31 NOTE — Progress Notes (Signed)
      [address] [place], [place]  [address] Phone: [phone] Fax:  [phone]  Date:  10/31/2013   ID:  [name] JunesHarold J [name], DOB March 06, 1946, MRN [id number]  PCP:  Gaye AlkenBARNES,ELIZABETH STEWART, MD   History of Present Illness: [name] FredricksonHarold J [name] is a 68 y.o. male with hypertension, hyperlipidemia with atrial fibrillation here for six-month followup. Atrial fibrillation was in the setting of cholecystitis in May of 2013. Currently taking anticoagulation. Normal ejection fraction, trace aortic insufficiency noted on echocardiogram.He's had persistent atrial fibrillation. Well rate controlled    Wt Readings from Last 3 Encounters:  10/31/13 305 lb (138.347 kg)  03/10/12 270 lb (122.471 kg)  02/18/12 277 lb 12.5 oz (126 kg)     Past Medical History  Diagnosis Date  . Hypertension   . High cholesterol     Past Surgical History  Procedure Laterality Date  . Hernia repair    . Appendectomy    . Cholecystectomy  02/19/2012    Procedure: LAPAROSCOPIC CHOLECYSTECTOMY WITH INTRAOPERATIVE CHOLANGIOGRAM;  Surgeon: Kandis Cockingavid H Newman, MD;  Location: [facility];  Service: General;  Laterality: N/A;    Current Outpatient Prescriptions  Medication Sig Dispense Refill  . fish oil-omega-3 fatty acids 1000 MG capsule Take 4 g by mouth daily.      Marland Kitchen. lisinopril (PRINIVIL,ZESTRIL) 10 MG tablet Take 10 mg by mouth daily.      . metoprolol (LOPRESSOR) 50 MG tablet Take 1 tablet (50 mg total) by mouth 2 (two) times daily.  60 tablet  0  . warfarin (COUMADIN) 5 MG tablet daily.       No current facility-administered medications for this visit.    Allergies:    Allergies  Allergen Reactions  . Statins Other (See Comments)    Liver enzymes     Social History:  The patient  reports that he quit smoking about 39 years ago. His smoking use included Cigarettes. He smoked 0.00 packs per day. He has never used smokeless tobacco. He reports that he drinks about 1.2 ounces of alcohol per week. He  reports that he does not use illicit drugs.   ROS:  Please see the history of present illness.   Denies any syncope, bleeding, orthopnea, PND. Positive weight gain.    PHYSICAL EXAM: VS:  BP 136/82  Pulse 87  Ht 5\' 9"  (1.753 m)  Wt 305 lb (138.347 kg)  BMI 45.02 kg/m2 Well nourished, well developed, in no acute distress HEENT: normal Neck: no JVD Cardiac:  Irregularly irregular; no murmur Lungs:  clear to auscultation bilaterally, no wheezing, rhonchi or rales Abd: soft, nontender, no hepatomegalyObese Ext: 1+ edema Skin: warm and dry Neuro: no focal abnormalities noted  EKG:  Atrial fibrillation rate 79 with T-wave inversion in V2, V3, no significant changes from prior.     ASSESSMENT AND PLAN:  1. Atrial fibrillation-persistent. Continue with rate control strategy. Doing well. 2. Morbid obesity-continue to encourage weight loss. Low carbohydrate diet. 3. Hypertension-very well controlled. 4. Hyperlipidemia-he is currently on fish oil to help with triglycerides. Weight loss will also help with this.  Signed, Donato SchultzMark Kadee Philyaw, MD [facility]  10/31/2013 2:44 PM

## 2013-10-31 NOTE — Patient Instructions (Signed)
Your physician recommends that you continue on your current medications as directed. Please refer to the Current Medication list given to you today.  Your physician wants you to follow-up in: 1 year with Dr. Anne Fu. You will receive a reminder letter in the mail two months in advance. If you don't receive a letter, please call our office to schedule the follow-up appointment.  Cardiac Diet This diet can help prevent heart disease and stroke. Many factors influence your heart health, including eating and exercise habits. Coronary risk rises a lot with abnormal blood fat (lipid) levels. Cardiac meal planning includes limiting unhealthy fats, increasing healthy fats, and making other small dietary changes. General guidelines are as follows:  Adjust calorie intake to reach and maintain desirable body weight.  Limit total fat intake to less than 30% of total calories. Saturated fat should be less than 7% of calories.  Saturated fats are found in animal products and in some vegetable products. Saturated vegetable fats are found in coconut oil, cocoa butter, palm oil, and palm kernel oil. Read labels carefully to avoid these products as much as possible. Use butter in moderation. Choose tub margarines and oils that have 2 grams of fat or less. Good cooking oils are canola and olive oils.  Practice low-fat cooking techniques. Do not fry food. Instead, broil, bake, boil, steam, grill, roast on a rack, stir-fry, or microwave it. Other fat reducing suggestions include:  Remove the skin from poultry.  Remove all visible fat from meats.  Skim the fat off stews, soups, and gravies before serving them.  Steam vegetables in water or broth instead of sauting them in fat.  Avoid foods with trans fat (or hydrogenated oils), such as commercially fried foods and commercially baked goods. Commercial shortening and deep-frying fats will contain trans fat.  Increase intake of fruits, vegetables, whole grains, and  legumes to replace foods high in fat.  Increase consumption of nuts, legumes, and seeds to at least 4 servings weekly. One serving of a legume equals  cup, and 1 serving of nuts or seeds equals  cup.  Choose whole grains more often. Have 3 servings per day (a serving is 1 ounce [oz]).  Eat 4 to 5 servings of vegetables per day. A serving of vegetables is 1 cup of raw leafy vegetables;  cup of raw or cooked cut-up vegetables;  cup of vegetable juice.  Eat 4 to 5 servings of fruit per day. A serving of fruit is 1 medium whole fruit;  cup of dried fruit;  cup of fresh, frozen, or canned fruit;  cup of 100% fruit juice.  Increase your intake of dietary fiber to 20 to 30 grams per day. Insoluble fiber may help lower your risk of heart disease and may help curb your appetite.  Soluble fiber binds cholesterol to be removed from the blood. Foods high in soluble fiber are dried beans, citrus fruits, oats, apples, bananas, broccoli, Brussels sprouts, and eggplant.  Try to include foods fortified with plant sterols or stanols, such as yogurt, breads, juices, or margarines. Choose several fortified foods to achieve a daily intake of 2 to 3 grams of plant sterols or stanols.  Foods with omega-3 fats can help reduce your risk of heart disease. Aim to have a 3.5 oz portion of fatty fish twice per week, such as salmon, mackerel, albacore tuna, sardines, lake trout, or herring. If you wish to take a fish oil supplement, choose one that contains 1 gram of both DHA and EPA.  Limit processed meats to 2 servings (3 oz portion) weekly.  Limit the sodium in your diet to 1500 milligrams (mg) per day. If you have high blood pressure, talk to a registered dietitian about a DASH (Dietary Approaches to Stop Hypertension) eating plan.  Limit sweets and beverages with added sugar, such as soda, to no more than 5 servings per week. One serving is:   1 tablespoon sugar.  1 tablespoon jelly or jam.   cup  sorbet.  1 cup lemonade.   cup regular soda. CHOOSING FOODS Starches  Allowed: Breads: All kinds (wheat, rye, raisin, white, oatmeal, [place], [place]French, and English muffin bread). Low-fat rolls: English muffins, frankfurter and hamburger buns, bagels, pita bread, tortillas (not fried). Pancakes, waffles, biscuits, and muffins made with recommended oil.  Avoid: Products made with saturated or trans fats, oils, or whole milk products. Butter rolls, cheese breads, croissants. Commercial doughnuts, muffins, sweet rolls, biscuits, waffles, pancakes, store-bought mixes. Crackers  Allowed: Low-fat crackers and snacks: Animal, graham, rye, saltine (with recommended oil, no lard), oyster, and matzo crackers. Bread sticks, melba toast, rusks, flatbread, pretzels, and light popcorn.  Avoid: High-fat crackers: cheese crackers, butter crackers, and those made with coconut, palm oil, or trans fat (hydrogenated oils). Buttered popcorn. Cereals  Allowed: Hot or cold whole-grain cereals.  Avoid: Cereals containing coconut, hydrogenated vegetable fat, or animal fat. Potatoes / Pasta / Rice  Allowed: All kinds of potatoes, rice, and pasta (such as macaroni, spaghetti, and noodles).  Avoid: Pasta or rice prepared with cream sauce or high-fat cheese. Chow mein noodles, JamaicaFrench fries. Vegetables  Allowed: All vegetables and vegetable juices.  Avoid: Fried vegetables. Vegetables in cream, butter, or high-fat cheese sauces. Limit coconut. Fruit in cream or custard. Protein  Allowed: Limit your intake of meat, seafood, and poultry to no more than 6 oz (cooked weight) per day. All lean, well-trimmed beef, veal, pork, and lamb. All chicken and [place]ur[place] without skin. All fish and shellfish. Wild game: wild duck, rabbit, pheasant, and venison. Egg whites or low-cholesterol egg substitutes may be used as desired. Meatless dishes: recipes with dried beans, peas, lentils, and tofu (soybean curd). Seeds and nuts: all  seeds and most nuts.  Avoid: Prime grade and other heavily marbled and fatty meats, such as short ribs, spare ribs, rib eye roast or steak, frankfurters, sausage, bacon, and high-fat luncheon meats, mutton. Caviar. Commercially fried fish. Domestic duck, goose, venison sausage. Organ meats: liver, gizzard, heart, chitterlings, brains, kidney, sweetbreads. Dairy  Allowed: Low-fat cheeses: nonfat or low-fat cottage cheese (1% or 2% fat), cheeses made with part skim milk, such as mozzarella, farmers, string, or ricotta. (Cheeses should be labeled no more than 2 to 6 grams fat per oz.). Skim (or 1%) milk: liquid, powdered, or evaporated. Buttermilk made with low-fat milk. Drinks made with skim or low-fat milk or cocoa. Chocolate milk or cocoa made with skim or low-fat (1%) milk. Nonfat or low-fat yogurt.  Avoid: Whole milk cheeses, including colby, cheddar, muenster, [address] Jack, [place]arti, [place]rie, [place]embert, [address], Swiss, and blue. Creamed cottage cheese, cream cheese. Whole milk and whole milk products, including buttermilk or yogurt made from whole milk, drinks made from whole milk. Condensed milk, evaporated whole milk, and 2% milk. Soups and Combination Foods  Allowed: Low-fat low-sodium soups: broth, dehydrated soups, homemade broth, soups with the fat removed, homemade cream soups made with skim or low-fat milk. Low-fat spaghetti, lasagna, chili, and Spanish rice if low-fat ingredients and low-fat cooking techniques are used.  Avoid: Cream soups  made with whole milk, cream, or high-fat cheese. All other soups. Desserts and Sweets  Allowed: Sherbet, fruit ices, gelatins, meringues, and angel food cake. Homemade desserts with recommended fats, oils, and milk products. Jam, jelly, honey, marmalade, sugars, and syrups. Pure sugar candy, such as gum drops, hard candy, jelly beans, marshmallows, mints, and small amounts of dark chocolate.  Avoid: Commercially prepared cakes, pies, cookies, frosting,  pudding, or mixes for these products. Desserts containing whole milk products, chocolate, coconut, lard, palm oil, or palm kernel oil. Ice cream or ice cream drinks. Candy that contains chocolate, coconut, butter, hydrogenated fat, or unknown ingredients. Buttered syrups. Fats and Oils  Allowed: Vegetable oils: safflower, sunflower, corn, soybean, cottonseed, sesame, canola, olive, or peanut. Non-hydrogenated margarines. Salad dressing or mayonnaise: homemade or commercial, made with a recommended oil. Low or nonfat salad dressing or mayonnaise.  Limit added fats and oils to 6 to 8 tsp per day (includes fats used in cooking, baking, salads, and spreads on bread). Remember to count the "hidden fats" in foods.  Avoid: Solid fats and shortenings: butter, lard, salt pork, bacon drippings. Gravy containing meat fat, shortening, or suet. Cocoa butter, coconut. Coconut oil, palm oil, palm kernel oil, or hydrogenated oils: these ingredients are often used in bakery products, nondairy creamers, whipped toppings, candy, and commercially fried foods. Read labels carefully. Salad dressings made of unknown oils, sour cream, or cheese, such as blue cheese and Roquefort. Cream, all kinds: half-and-half, light, heavy, or whipping. Sour cream or cream cheese (even if "light" or low-fat). Nondairy cream substitutes: coffee creamers and sour cream substitutes made with palm, palm kernel, hydrogenated oils, or coconut oil. Beverages  Allowed: Coffee (regular or decaffeinated), tea. Diet carbonated beverages, mineral water. Alcohol: Check with your caregiver. Moderation is recommended.  Avoid: Whole milk, regular sodas, and juice drinks with added sugar. Condiments  Allowed: All seasonings and condiments. Cocoa powder. "Cream" sauces made with recommended ingredients.  Avoid: Carob powder made with hydrogenated fats. SAMPLE MENU Breakfast   cup orange juice   cup oatmeal  1 slice toast  1 tsp margarine  1  cup skim milk Lunch  [place] sandwich with 2 oz [place], 2 slices bread  Lettuce and tomato slices  Fresh fruit  Carrot sticks  Coffee or tea Snack  Fresh fruit or low-fat crackers Dinner  3 oz lean ground beef  1 baked potato  1 tsp margarine   cup asparagus  Lettuce salad  1 tbs non-creamy dressing   cup peach slices  1 cup skim milk Document Released: 06/15/2008 Document Revised: 03/07/2012 Document Reviewed: 11/30/2011 ExitCare Patient Information 2014 [place], [place].

## 2013-12-05 ENCOUNTER — Ambulatory Visit (INDEPENDENT_AMBULATORY_CARE_PROVIDER_SITE_OTHER): Payer: Medicare Other | Admitting: Pharmacist

## 2013-12-05 DIAGNOSIS — Z5181 Encounter for therapeutic drug level monitoring: Secondary | ICD-10-CM

## 2013-12-05 DIAGNOSIS — I4891 Unspecified atrial fibrillation: Secondary | ICD-10-CM

## 2013-12-05 LAB — POCT INR: INR: 1.6

## 2013-12-20 ENCOUNTER — Ambulatory Visit (INDEPENDENT_AMBULATORY_CARE_PROVIDER_SITE_OTHER): Payer: Medicare Other | Admitting: Pharmacist Clinician (PhC)/ Clinical Pharmacy Specialist

## 2013-12-20 DIAGNOSIS — Z5181 Encounter for therapeutic drug level monitoring: Secondary | ICD-10-CM

## 2013-12-20 DIAGNOSIS — I4891 Unspecified atrial fibrillation: Secondary | ICD-10-CM

## 2013-12-20 LAB — POCT INR: INR: 1.6

## 2013-12-23 ENCOUNTER — Other Ambulatory Visit: Payer: Self-pay | Admitting: Cardiology

## 2014-01-01 ENCOUNTER — Ambulatory Visit (INDEPENDENT_AMBULATORY_CARE_PROVIDER_SITE_OTHER): Payer: Medicare Other | Admitting: Pharmacist

## 2014-01-01 DIAGNOSIS — Z5181 Encounter for therapeutic drug level monitoring: Secondary | ICD-10-CM

## 2014-01-01 DIAGNOSIS — I4891 Unspecified atrial fibrillation: Secondary | ICD-10-CM

## 2014-01-01 LAB — POCT INR: INR: 1.7

## 2014-01-16 ENCOUNTER — Ambulatory Visit (INDEPENDENT_AMBULATORY_CARE_PROVIDER_SITE_OTHER): Payer: Medicare Other | Admitting: *Deleted

## 2014-01-16 DIAGNOSIS — Z5181 Encounter for therapeutic drug level monitoring: Secondary | ICD-10-CM

## 2014-01-16 DIAGNOSIS — I4891 Unspecified atrial fibrillation: Secondary | ICD-10-CM

## 2014-01-16 LAB — POCT INR: INR: 2

## 2014-01-24 ENCOUNTER — Ambulatory Visit (INDEPENDENT_AMBULATORY_CARE_PROVIDER_SITE_OTHER): Payer: Medicare Other

## 2014-01-24 DIAGNOSIS — Z5181 Encounter for therapeutic drug level monitoring: Secondary | ICD-10-CM

## 2014-01-24 DIAGNOSIS — I4891 Unspecified atrial fibrillation: Secondary | ICD-10-CM

## 2014-01-24 LAB — POCT INR: INR: 2.1

## 2014-02-27 ENCOUNTER — Ambulatory Visit (INDEPENDENT_AMBULATORY_CARE_PROVIDER_SITE_OTHER): Payer: Medicare Other | Admitting: *Deleted

## 2014-02-27 DIAGNOSIS — Z5181 Encounter for therapeutic drug level monitoring: Secondary | ICD-10-CM

## 2014-02-27 DIAGNOSIS — I4891 Unspecified atrial fibrillation: Secondary | ICD-10-CM

## 2014-02-27 LAB — POCT INR: INR: 2.4

## 2014-03-20 ENCOUNTER — Other Ambulatory Visit: Payer: Self-pay | Admitting: Cardiology

## 2014-03-20 ENCOUNTER — Encounter: Payer: Self-pay | Admitting: *Deleted

## 2014-04-10 ENCOUNTER — Ambulatory Visit (INDEPENDENT_AMBULATORY_CARE_PROVIDER_SITE_OTHER): Payer: Medicare Other | Admitting: *Deleted

## 2014-04-10 DIAGNOSIS — I4891 Unspecified atrial fibrillation: Secondary | ICD-10-CM

## 2014-04-10 DIAGNOSIS — Z5181 Encounter for therapeutic drug level monitoring: Secondary | ICD-10-CM

## 2014-04-10 LAB — POCT INR: INR: 2.3

## 2014-04-10 MED ORDER — WARFARIN SODIUM 5 MG PO TABS: ORAL_TABLET | ORAL | Status: DC

## 2014-05-15 ENCOUNTER — Ambulatory Visit (INDEPENDENT_AMBULATORY_CARE_PROVIDER_SITE_OTHER): Payer: Medicare Other | Admitting: *Deleted

## 2014-05-15 DIAGNOSIS — I4891 Unspecified atrial fibrillation: Secondary | ICD-10-CM

## 2014-05-15 DIAGNOSIS — Z5181 Encounter for therapeutic drug level monitoring: Secondary | ICD-10-CM

## 2014-05-15 LAB — POCT INR: INR: 2.7

## 2014-06-27 ENCOUNTER — Ambulatory Visit (INDEPENDENT_AMBULATORY_CARE_PROVIDER_SITE_OTHER): Payer: Medicare Other

## 2014-06-27 DIAGNOSIS — Z5181 Encounter for therapeutic drug level monitoring: Secondary | ICD-10-CM

## 2014-06-27 DIAGNOSIS — I4891 Unspecified atrial fibrillation: Secondary | ICD-10-CM

## 2014-06-27 LAB — POCT INR: INR: 2.8

## 2014-08-08 ENCOUNTER — Ambulatory Visit (INDEPENDENT_AMBULATORY_CARE_PROVIDER_SITE_OTHER): Payer: Medicare Other | Admitting: *Deleted

## 2014-08-08 DIAGNOSIS — I4891 Unspecified atrial fibrillation: Secondary | ICD-10-CM

## 2014-08-08 DIAGNOSIS — Z5181 Encounter for therapeutic drug level monitoring: Secondary | ICD-10-CM

## 2014-08-08 LAB — POCT INR: INR: 2.3

## 2014-08-08 MED ORDER — WARFARIN SODIUM 5 MG PO TABS: ORAL_TABLET | ORAL | Status: DC

## 2014-09-19 ENCOUNTER — Ambulatory Visit (INDEPENDENT_AMBULATORY_CARE_PROVIDER_SITE_OTHER): Payer: Medicare Other | Admitting: *Deleted

## 2014-09-19 DIAGNOSIS — Z5181 Encounter for therapeutic drug level monitoring: Secondary | ICD-10-CM

## 2014-09-19 DIAGNOSIS — I4891 Unspecified atrial fibrillation: Secondary | ICD-10-CM

## 2014-09-19 LAB — POCT INR: INR: 2.7

## 2014-10-06 ENCOUNTER — Other Ambulatory Visit: Payer: Self-pay | Admitting: Cardiology

## 2014-10-07 ENCOUNTER — Encounter: Payer: Self-pay | Admitting: Cardiology

## 2014-10-25 ENCOUNTER — Ambulatory Visit (INDEPENDENT_AMBULATORY_CARE_PROVIDER_SITE_OTHER): Payer: Medicare Other | Admitting: Cardiology

## 2014-10-25 ENCOUNTER — Encounter: Payer: Self-pay | Admitting: Cardiology

## 2014-10-25 VITALS — BP 126/90 | HR 73 | Ht 70.0 in | Wt 287.0 lb

## 2014-10-25 DIAGNOSIS — I480 Paroxysmal atrial fibrillation: Secondary | ICD-10-CM

## 2014-10-25 DIAGNOSIS — I10 Essential (primary) hypertension: Secondary | ICD-10-CM

## 2014-10-25 DIAGNOSIS — Z5181 Encounter for therapeutic drug level monitoring: Secondary | ICD-10-CM

## 2014-10-25 DIAGNOSIS — E785 Hyperlipidemia, unspecified: Secondary | ICD-10-CM

## 2014-10-25 MED ORDER — METOPROLOL TARTRATE 50 MG PO TABS: 50.0000 mg | ORAL_TABLET | Freq: Two times a day (BID) | ORAL | Status: DC

## 2014-10-25 NOTE — Progress Notes (Signed)
[address] [place], [place]  [address] Phone: [phone] Fax:  [phone]  Date:  10/25/2014   ID:  Leonid, Manus 1946/08/12, MRN [id number]  PCP:  Gaye Alken, MD   History of Present Illness: [name] [name] is a 69 y.o. male with hypertension, hyperlipidemia with atrial fibrillation here for six-month followup. Atrial fibrillation was in the setting of cholecystitis in May of 2013. Currently taking anticoagulation. Normal ejection fraction, trace aortic insufficiency noted on echocardiogram.He's had persistent atrial fibrillation. Well rate controlled.  Currently doing well. No chest pain, no shortness of breath. No syncope, no bleeding. He has lost weight.    Wt Readings from Last 3 Encounters:  10/25/14 287 lb (130.182 kg)  10/31/13 305 lb (138.347 kg)  03/10/12 270 lb (122.471 kg)     Past Medical History  Diagnosis Date  . Hypertension   . High cholesterol   . Obesity   . DDD (degenerative disc disease)   . A-fib     Past Surgical History  Procedure Laterality Date  . Hernia repair    . Appendectomy    . Cholecystectomy  02/19/2012    Procedure: LAPAROSCOPIC CHOLECYSTECTOMY WITH INTRAOPERATIVE CHOLANGIOGRAM;  Surgeon: Kandis Cocking, MD;  Location: [facility];  Service: General;  Laterality: N/A;    Current Outpatient Prescriptions  Medication Sig Dispense Refill  . fish oil-omega-3 fatty acids 1000 MG capsule Take 4 g by mouth daily.    Marland Kitchen lisinopril (PRINIVIL,ZESTRIL) 10 MG tablet Take 10 mg by mouth daily.    . metoprolol (LOPRESSOR) 50 MG tablet TAKE ONE TABLET BY MOUTH TWICE DAILY  60 tablet 0  . warfarin (COUMADIN) 5 MG tablet 1 tablet on all days except 1.5 tablets on Monday, Wednesday, Friday, and Saturday or as directed by coumadin clinic 45 tablet 3   No current facility-administered medications for this visit.    Allergies:    Allergies  Allergen Reactions  . Crestor [Rosuvastatin]     Muscle aches, leg  cramps   . Statins Other (See Comments)    Liver enzymes   . Welchol [Colesevelam Hcl]     Increased liver functions    Social History:  The patient  reports that he quit smoking about 40 years ago. His smoking use included Cigarettes. He has never used smokeless tobacco. He reports that he drinks about 1.2 oz of alcohol per week. He reports that he does not use illicit drugs.   ROS:  Please see the history of present illness.   Denies any syncope, bleeding, orthopnea, PND. Positive weight gain.    PHYSICAL EXAM: VS:  BP 126/90 mmHg  Pulse 73  Ht  (1.778 m)  Wt 287 lb (130.182 kg)  BMI 41.18 kg/m2 Well nourished, well developed, in no acute distress HEENT: normal Neck: no JVD Cardiac:  Irregularly irregular; no murmur Lungs:  clear to auscultation bilaterally, no wheezing, rhonchi or rales Abd: soft, nontender, no hepatomegalyObese Ext: 1+ edema Skin: warm and dry Neuro: no focal abnormalities noted  EKG: 10/25/14-atrial fibrillation, heart rate 73, T-wave inversion V2 as before- Atrial fibrillation rate 79 with T-wave inversion in V2, V3, no significant changes from prior.     ASSESSMENT AND PLAN:  1. Atrial fibrillation-persistent. Continue with rate control strategy. Doing well. 2. Morbid obesity-continue to encourage weight loss. Low carbohydrate diet. 3. Hypertension-very well controlled. 118/80 at Dr. Zachery Dauer.  4. Hyperlipidemia-he is currently on fish oil to help with triglycerides.  Weight loss will also help with this. He has cut back on carbohydrates. Good job. 5. One year follow up.  Signed, Donato SchultzMark Skains, MD [facility]  10/25/2014 3:17 PM

## 2014-10-25 NOTE — Patient Instructions (Signed)
The current medical regimen is effective;  continue present plan and medications.  Follow up in 1 year with Dr. Skains.  You will receive a letter in the mail 2 months before you are due.  Please call us when you receive this letter to schedule your follow up appointment.  Thank you for choosing [facility]!!     

## 2014-10-28 ENCOUNTER — Ambulatory Visit (INDEPENDENT_AMBULATORY_CARE_PROVIDER_SITE_OTHER): Payer: Medicare Other | Admitting: Pharmacist

## 2014-10-28 DIAGNOSIS — I4891 Unspecified atrial fibrillation: Secondary | ICD-10-CM

## 2014-10-28 DIAGNOSIS — I480 Paroxysmal atrial fibrillation: Secondary | ICD-10-CM

## 2014-10-28 DIAGNOSIS — Z5181 Encounter for therapeutic drug level monitoring: Secondary | ICD-10-CM

## 2014-10-28 LAB — POCT INR: INR: 3.8

## 2014-11-27 ENCOUNTER — Ambulatory Visit (INDEPENDENT_AMBULATORY_CARE_PROVIDER_SITE_OTHER): Payer: Medicare Other | Admitting: *Deleted

## 2014-11-27 DIAGNOSIS — I4891 Unspecified atrial fibrillation: Secondary | ICD-10-CM | POA: Diagnosis not present

## 2014-11-27 DIAGNOSIS — I480 Paroxysmal atrial fibrillation: Secondary | ICD-10-CM | POA: Diagnosis not present

## 2014-11-27 DIAGNOSIS — Z5181 Encounter for therapeutic drug level monitoring: Secondary | ICD-10-CM | POA: Diagnosis not present

## 2014-11-27 LAB — POCT INR: INR: 2.3

## 2014-11-27 MED ORDER — WARFARIN SODIUM 5 MG PO TABS: ORAL_TABLET | ORAL | Status: DC

## 2015-01-03 ENCOUNTER — Ambulatory Visit (INDEPENDENT_AMBULATORY_CARE_PROVIDER_SITE_OTHER): Payer: Medicare Other

## 2015-01-03 DIAGNOSIS — I4891 Unspecified atrial fibrillation: Secondary | ICD-10-CM

## 2015-01-03 DIAGNOSIS — Z5181 Encounter for therapeutic drug level monitoring: Secondary | ICD-10-CM

## 2015-01-03 LAB — POCT INR: INR: 3.3

## 2015-01-13 ENCOUNTER — Encounter: Payer: Self-pay | Admitting: Cardiology

## 2015-02-03 ENCOUNTER — Ambulatory Visit (INDEPENDENT_AMBULATORY_CARE_PROVIDER_SITE_OTHER): Payer: Medicare Other | Admitting: *Deleted

## 2015-02-03 DIAGNOSIS — Z5181 Encounter for therapeutic drug level monitoring: Secondary | ICD-10-CM | POA: Diagnosis not present

## 2015-02-03 DIAGNOSIS — I4891 Unspecified atrial fibrillation: Secondary | ICD-10-CM | POA: Diagnosis not present

## 2015-02-03 LAB — POCT INR: INR: 4.5

## 2015-02-24 ENCOUNTER — Ambulatory Visit (INDEPENDENT_AMBULATORY_CARE_PROVIDER_SITE_OTHER): Payer: Medicare Other

## 2015-02-24 DIAGNOSIS — I4891 Unspecified atrial fibrillation: Secondary | ICD-10-CM

## 2015-02-24 DIAGNOSIS — Z5181 Encounter for therapeutic drug level monitoring: Secondary | ICD-10-CM

## 2015-02-24 LAB — POCT INR: INR: 3.6

## 2015-03-18 ENCOUNTER — Ambulatory Visit (INDEPENDENT_AMBULATORY_CARE_PROVIDER_SITE_OTHER): Payer: Medicare Other | Admitting: *Deleted

## 2015-03-18 DIAGNOSIS — I4891 Unspecified atrial fibrillation: Secondary | ICD-10-CM

## 2015-03-18 DIAGNOSIS — Z5181 Encounter for therapeutic drug level monitoring: Secondary | ICD-10-CM | POA: Diagnosis not present

## 2015-03-18 LAB — POCT INR: INR: 2.6

## 2015-04-15 ENCOUNTER — Ambulatory Visit (INDEPENDENT_AMBULATORY_CARE_PROVIDER_SITE_OTHER): Payer: Medicare Other | Admitting: *Deleted

## 2015-04-15 DIAGNOSIS — I4891 Unspecified atrial fibrillation: Secondary | ICD-10-CM

## 2015-04-15 DIAGNOSIS — Z5181 Encounter for therapeutic drug level monitoring: Secondary | ICD-10-CM | POA: Diagnosis not present

## 2015-04-15 LAB — POCT INR: INR: 2.8

## 2015-05-10 ENCOUNTER — Other Ambulatory Visit: Payer: Self-pay | Admitting: Cardiology

## 2015-05-13 ENCOUNTER — Other Ambulatory Visit: Payer: Self-pay

## 2015-05-13 ENCOUNTER — Ambulatory Visit (INDEPENDENT_AMBULATORY_CARE_PROVIDER_SITE_OTHER): Payer: Medicare Other | Admitting: Pharmacist Clinician (PhC)/ Clinical Pharmacy Specialist

## 2015-05-13 DIAGNOSIS — Z5181 Encounter for therapeutic drug level monitoring: Secondary | ICD-10-CM | POA: Diagnosis not present

## 2015-05-13 DIAGNOSIS — I4891 Unspecified atrial fibrillation: Secondary | ICD-10-CM | POA: Diagnosis not present

## 2015-05-13 LAB — POCT INR: INR: 3.4

## 2015-05-13 MED ORDER — WARFARIN SODIUM 5 MG PO TABS: ORAL_TABLET | ORAL | Status: DC

## 2015-05-27 ENCOUNTER — Ambulatory Visit (INDEPENDENT_AMBULATORY_CARE_PROVIDER_SITE_OTHER): Payer: Medicare Other | Admitting: *Deleted

## 2015-05-27 DIAGNOSIS — I4891 Unspecified atrial fibrillation: Secondary | ICD-10-CM | POA: Diagnosis not present

## 2015-05-27 DIAGNOSIS — Z5181 Encounter for therapeutic drug level monitoring: Secondary | ICD-10-CM | POA: Diagnosis not present

## 2015-05-27 LAB — POCT INR: INR: 2

## 2015-06-10 ENCOUNTER — Ambulatory Visit (INDEPENDENT_AMBULATORY_CARE_PROVIDER_SITE_OTHER): Payer: Medicare Other | Admitting: *Deleted

## 2015-06-10 DIAGNOSIS — I4891 Unspecified atrial fibrillation: Secondary | ICD-10-CM

## 2015-06-10 DIAGNOSIS — Z5181 Encounter for therapeutic drug level monitoring: Secondary | ICD-10-CM | POA: Diagnosis not present

## 2015-06-10 LAB — POCT INR: INR: 2

## 2015-07-09 ENCOUNTER — Ambulatory Visit (INDEPENDENT_AMBULATORY_CARE_PROVIDER_SITE_OTHER): Payer: Medicare Other | Admitting: *Deleted

## 2015-07-09 DIAGNOSIS — Z5181 Encounter for therapeutic drug level monitoring: Secondary | ICD-10-CM

## 2015-07-09 DIAGNOSIS — I4891 Unspecified atrial fibrillation: Secondary | ICD-10-CM | POA: Diagnosis not present

## 2015-07-09 LAB — POCT INR: INR: 2.2

## 2015-08-06 ENCOUNTER — Ambulatory Visit (INDEPENDENT_AMBULATORY_CARE_PROVIDER_SITE_OTHER): Payer: Medicare Other | Admitting: *Deleted

## 2015-08-06 DIAGNOSIS — Z5181 Encounter for therapeutic drug level monitoring: Secondary | ICD-10-CM | POA: Diagnosis not present

## 2015-08-06 DIAGNOSIS — I4891 Unspecified atrial fibrillation: Secondary | ICD-10-CM

## 2015-08-06 LAB — POCT INR: INR: 1.8

## 2015-09-03 ENCOUNTER — Ambulatory Visit (INDEPENDENT_AMBULATORY_CARE_PROVIDER_SITE_OTHER): Payer: Medicare Other | Admitting: *Deleted

## 2015-09-03 DIAGNOSIS — Z5181 Encounter for therapeutic drug level monitoring: Secondary | ICD-10-CM | POA: Diagnosis not present

## 2015-09-03 DIAGNOSIS — I4891 Unspecified atrial fibrillation: Secondary | ICD-10-CM

## 2015-09-03 LAB — POCT INR: INR: 2.8

## 2015-09-03 MED ORDER — WARFARIN SODIUM 5 MG PO TABS: ORAL_TABLET | ORAL | Status: DC

## 2015-10-01 ENCOUNTER — Ambulatory Visit (INDEPENDENT_AMBULATORY_CARE_PROVIDER_SITE_OTHER): Payer: Medicare Other | Admitting: *Deleted

## 2015-10-01 DIAGNOSIS — I4891 Unspecified atrial fibrillation: Secondary | ICD-10-CM | POA: Diagnosis not present

## 2015-10-01 DIAGNOSIS — Z5181 Encounter for therapeutic drug level monitoring: Secondary | ICD-10-CM

## 2015-10-01 LAB — POCT INR: INR: 2.6

## 2015-10-22 ENCOUNTER — Ambulatory Visit (INDEPENDENT_AMBULATORY_CARE_PROVIDER_SITE_OTHER): Payer: Medicare Other | Admitting: *Deleted

## 2015-10-22 DIAGNOSIS — Z5181 Encounter for therapeutic drug level monitoring: Secondary | ICD-10-CM | POA: Diagnosis not present

## 2015-10-22 DIAGNOSIS — I4891 Unspecified atrial fibrillation: Secondary | ICD-10-CM | POA: Diagnosis not present

## 2015-10-22 LAB — POCT INR: INR: 3.1

## 2015-11-12 ENCOUNTER — Encounter (INDEPENDENT_AMBULATORY_CARE_PROVIDER_SITE_OTHER): Payer: Self-pay

## 2015-11-12 ENCOUNTER — Ambulatory Visit (INDEPENDENT_AMBULATORY_CARE_PROVIDER_SITE_OTHER): Payer: Medicare Other | Admitting: Pharmacist

## 2015-11-12 DIAGNOSIS — I4891 Unspecified atrial fibrillation: Secondary | ICD-10-CM | POA: Diagnosis not present

## 2015-11-12 DIAGNOSIS — Z5181 Encounter for therapeutic drug level monitoring: Secondary | ICD-10-CM | POA: Diagnosis not present

## 2015-11-12 LAB — POCT INR: INR: 2.2

## 2015-12-17 ENCOUNTER — Ambulatory Visit (INDEPENDENT_AMBULATORY_CARE_PROVIDER_SITE_OTHER): Payer: Medicare Other | Admitting: *Deleted

## 2015-12-17 DIAGNOSIS — I4891 Unspecified atrial fibrillation: Secondary | ICD-10-CM

## 2015-12-17 DIAGNOSIS — Z5181 Encounter for therapeutic drug level monitoring: Secondary | ICD-10-CM

## 2015-12-17 LAB — POCT INR: INR: 3.3

## 2016-01-04 ENCOUNTER — Other Ambulatory Visit: Payer: Self-pay | Admitting: Cardiology

## 2016-01-07 ENCOUNTER — Ambulatory Visit (INDEPENDENT_AMBULATORY_CARE_PROVIDER_SITE_OTHER): Payer: Medicare Other | Admitting: Pharmacist

## 2016-01-07 ENCOUNTER — Other Ambulatory Visit: Payer: Self-pay | Admitting: *Deleted

## 2016-01-07 DIAGNOSIS — I4891 Unspecified atrial fibrillation: Secondary | ICD-10-CM | POA: Diagnosis not present

## 2016-01-07 DIAGNOSIS — Z5181 Encounter for therapeutic drug level monitoring: Secondary | ICD-10-CM

## 2016-01-07 LAB — POCT INR: INR: 4.3

## 2016-01-07 MED ORDER — METOPROLOL TARTRATE 50 MG PO TABS: 50.0000 mg | ORAL_TABLET | Freq: Two times a day (BID) | ORAL | Status: DC

## 2016-01-14 ENCOUNTER — Ambulatory Visit (INDEPENDENT_AMBULATORY_CARE_PROVIDER_SITE_OTHER): Payer: Medicare Other | Admitting: Cardiology

## 2016-01-14 ENCOUNTER — Encounter: Payer: Self-pay | Admitting: Cardiology

## 2016-01-14 ENCOUNTER — Other Ambulatory Visit: Payer: Self-pay

## 2016-01-14 VITALS — BP 104/80 | HR 77 | Ht 70.0 in | Wt 304.1 lb

## 2016-01-14 DIAGNOSIS — I482 Chronic atrial fibrillation, unspecified: Secondary | ICD-10-CM

## 2016-01-14 DIAGNOSIS — Z5181 Encounter for therapeutic drug level monitoring: Secondary | ICD-10-CM | POA: Diagnosis not present

## 2016-01-14 DIAGNOSIS — Z889 Allergy status to unspecified drugs, medicaments and biological substances status: Secondary | ICD-10-CM | POA: Diagnosis not present

## 2016-01-14 DIAGNOSIS — E785 Hyperlipidemia, unspecified: Secondary | ICD-10-CM

## 2016-01-14 DIAGNOSIS — Z789 Other specified health status: Secondary | ICD-10-CM

## 2016-01-14 NOTE — Progress Notes (Signed)
[address] [place], [place]  [address] Phone: [phone] Fax:  [phone]  Date:  01/14/2016   ID:  Lorn JunesHarold J Lippens, DOB 1945-10-24, MRN [id number]  PCP:  Gaye AlkenBARNES,ELIZABETH STEWART, MD   History of Present Illness: [name] is a 70 y.o. male with hypertension, hyperlipidemia with atrial fibrillation here for 1545-month followup.   Atrial fibrillation was in the setting of cholecystitis in May of 2013, however upon return visits, he was noted to be in persistent atrial fibrillation that was asymptomatic.. Currently taking anticoagulation, no issues, no bleeding. Normal ejection fraction, trace aortic insufficiency noted on echocardiogram. Well rate controlled.  Currently doing well. No chest pain, no shortness of breath. No syncope, no bleeding.   He still struggling with his obesity. We discussed at length.  He also had trouble in the past with statin intolerance  Roddie McMarlene, wife retired Engineer, civil (consulting)nurse, is a patient of mine.   Wt Readings from Last 3 Encounters:  01/14/16 304 lb 1.9 oz (137.948 kg)  10/25/14 287 lb (130.182 kg)  10/31/13 305 lb (138.347 kg)     Past Medical History  Diagnosis Date  . Hypertension   . High cholesterol   . Obesity   . DDD (degenerative disc disease)   . A-fib [facility](HCC)     Past Surgical History  Procedure Laterality Date  . Hernia repair    . Appendectomy    . Cholecystectomy  02/19/2012    Procedure: LAPAROSCOPIC CHOLECYSTECTOMY WITH INTRAOPERATIVE CHOLANGIOGRAM;  Surgeon: Kandis Cockingavid H Newman, MD;  Location: [facility];  Service: General;  Laterality: N/A;    Current Outpatient Prescriptions  Medication Sig Dispense Refill  . cyanocobalamin 100 MCG tablet Take 100 mcg by mouth daily.    . fish oil-omega-3 fatty acids 1000 MG capsule Take 4 g by mouth daily.    Marland Kitchen. lisinopril (PRINIVIL,ZESTRIL) 10 MG tablet Take 10 mg by mouth daily.    . metoprolol (LOPRESSOR) 50 MG tablet Take 1 tablet (50 mg total) by mouth 2 (two) times daily.  60 tablet 0  . warfarin (COUMADIN) 5 MG tablet TAKE AS DIRECTED BY COUMADIN CLINIC 45 tablet 3   No current facility-administered medications for this visit.    Allergies:    Allergies  Allergen Reactions  . Crestor [Rosuvastatin]     Muscle aches, leg cramps   . Statins Other (See Comments)    Liver enzymes   . Welchol [Colesevelam Hcl]     Increased liver functions    Social History:  The patient  reports that he quit smoking about 41 years ago. His smoking use included Cigarettes. He has never used smokeless tobacco. He reports that he drinks about 1.2 oz of alcohol per week. He reports that he does not use illicit drugs.   ROS:  Please see the history of present illness.   Denies any syncope, bleeding, orthopnea, PND. Positive weight gain.    PHYSICAL EXAM: VS:  BP 104/80 mmHg  Pulse 77  Ht 5\' 10"  (1.778 m)  Wt 304 lb 1.9 oz (137.948 kg)  BMI 43.64 kg/m2  SpO2 96% Well nourished, well developed, in no acute distress HEENT: normal Neck: no JVD Cardiac:  Irregularly irregular; no murmur Lungs:  clear to auscultation bilaterally, no wheezing, rhonchi or rales Abd: soft, nontender, no hepatomegalyObese Ext: 1+ edema Skin: warm and dry Neuro: no focal abnormalities noted  EKG: 10/25/14-atrial fibrillation, heart rate 73, T-wave inversion V2 as before- Atrial fibrillation rate 79 with  T-wave inversion in V2, V3, no significant changes from prior.     ASSESSMENT AND PLAN:  1. Atrial fibrillation-persistent. Continue with rate control strategy. Doing well. 2. Morbid obesity-continue to encourage weight loss. Low carbohydrate diet. 3. Hypertension-very well controlled. 118/80 at Dr. Zachery Dauer. Continuing with lisinopril, which could help with remodeling. 4. Hyperlipidemia-he is currently on fish oil to help with triglycerides. Weight loss will also help with this. He has cut back on carbohydrates. Good job. 5. Statin intolerance - knee, shoulder ache. After stopping, this all  went away. He had tried WelChol in the past but his liver enzymes elevated. He is now just on fish oil. He would not qualify for injectable. Could consider Zetia in the future. 6. One year follow up.  Signed, Donato Schultz, MD [facility]  01/14/2016 9:30 AM

## 2016-01-14 NOTE — Patient Instructions (Signed)

## 2016-01-21 ENCOUNTER — Ambulatory Visit (INDEPENDENT_AMBULATORY_CARE_PROVIDER_SITE_OTHER): Payer: Medicare Other | Admitting: *Deleted

## 2016-01-21 DIAGNOSIS — I482 Chronic atrial fibrillation, unspecified: Secondary | ICD-10-CM

## 2016-01-21 DIAGNOSIS — Z5181 Encounter for therapeutic drug level monitoring: Secondary | ICD-10-CM

## 2016-01-21 DIAGNOSIS — I4891 Unspecified atrial fibrillation: Secondary | ICD-10-CM

## 2016-01-21 LAB — POCT INR: INR: 2.6

## 2016-01-28 ENCOUNTER — Other Ambulatory Visit: Payer: Self-pay | Admitting: *Deleted

## 2016-01-28 MED ORDER — METOPROLOL TARTRATE 50 MG PO TABS: 50.0000 mg | ORAL_TABLET | Freq: Two times a day (BID) | ORAL | Status: DC

## 2016-02-04 ENCOUNTER — Ambulatory Visit (INDEPENDENT_AMBULATORY_CARE_PROVIDER_SITE_OTHER): Payer: Medicare Other | Admitting: *Deleted

## 2016-02-04 DIAGNOSIS — I482 Chronic atrial fibrillation, unspecified: Secondary | ICD-10-CM

## 2016-02-04 DIAGNOSIS — I4891 Unspecified atrial fibrillation: Secondary | ICD-10-CM

## 2016-02-04 DIAGNOSIS — Z5181 Encounter for therapeutic drug level monitoring: Secondary | ICD-10-CM | POA: Diagnosis not present

## 2016-02-04 LAB — POCT INR: INR: 2.8

## 2016-03-03 ENCOUNTER — Ambulatory Visit (INDEPENDENT_AMBULATORY_CARE_PROVIDER_SITE_OTHER): Payer: Medicare Other | Admitting: *Deleted

## 2016-03-03 DIAGNOSIS — I4891 Unspecified atrial fibrillation: Secondary | ICD-10-CM

## 2016-03-03 DIAGNOSIS — I482 Chronic atrial fibrillation, unspecified: Secondary | ICD-10-CM

## 2016-03-03 DIAGNOSIS — Z5181 Encounter for therapeutic drug level monitoring: Secondary | ICD-10-CM

## 2016-03-03 LAB — POCT INR: INR: 3

## 2016-03-26 ENCOUNTER — Other Ambulatory Visit: Payer: Self-pay | Admitting: Cardiology

## 2016-03-31 ENCOUNTER — Ambulatory Visit (INDEPENDENT_AMBULATORY_CARE_PROVIDER_SITE_OTHER): Payer: Medicare Other | Admitting: Pharmacist

## 2016-03-31 ENCOUNTER — Encounter (INDEPENDENT_AMBULATORY_CARE_PROVIDER_SITE_OTHER): Payer: Self-pay

## 2016-03-31 DIAGNOSIS — I482 Chronic atrial fibrillation, unspecified: Secondary | ICD-10-CM

## 2016-03-31 DIAGNOSIS — I4891 Unspecified atrial fibrillation: Secondary | ICD-10-CM

## 2016-03-31 DIAGNOSIS — Z5181 Encounter for therapeutic drug level monitoring: Secondary | ICD-10-CM

## 2016-03-31 LAB — POCT INR: INR: 4

## 2016-04-21 ENCOUNTER — Ambulatory Visit (INDEPENDENT_AMBULATORY_CARE_PROVIDER_SITE_OTHER): Payer: Medicare Other | Admitting: *Deleted

## 2016-04-21 DIAGNOSIS — I482 Chronic atrial fibrillation, unspecified: Secondary | ICD-10-CM

## 2016-04-21 DIAGNOSIS — I4891 Unspecified atrial fibrillation: Secondary | ICD-10-CM | POA: Diagnosis not present

## 2016-04-21 DIAGNOSIS — Z5181 Encounter for therapeutic drug level monitoring: Secondary | ICD-10-CM

## 2016-04-21 LAB — POCT INR: INR: 3.5

## 2016-05-13 ENCOUNTER — Ambulatory Visit (INDEPENDENT_AMBULATORY_CARE_PROVIDER_SITE_OTHER): Payer: Medicare Other | Admitting: *Deleted

## 2016-05-13 DIAGNOSIS — I482 Chronic atrial fibrillation, unspecified: Secondary | ICD-10-CM

## 2016-05-13 DIAGNOSIS — Z5181 Encounter for therapeutic drug level monitoring: Secondary | ICD-10-CM | POA: Diagnosis not present

## 2016-05-13 DIAGNOSIS — I4891 Unspecified atrial fibrillation: Secondary | ICD-10-CM

## 2016-05-13 LAB — POCT INR: INR: 2.9

## 2016-05-27 ENCOUNTER — Other Ambulatory Visit: Payer: Self-pay

## 2016-06-10 ENCOUNTER — Ambulatory Visit (INDEPENDENT_AMBULATORY_CARE_PROVIDER_SITE_OTHER): Payer: Medicare Other | Admitting: Pharmacist

## 2016-06-10 DIAGNOSIS — I4891 Unspecified atrial fibrillation: Secondary | ICD-10-CM | POA: Diagnosis not present

## 2016-06-10 DIAGNOSIS — Z5181 Encounter for therapeutic drug level monitoring: Secondary | ICD-10-CM

## 2016-06-10 LAB — POCT INR: INR: 2.6

## 2016-07-08 ENCOUNTER — Ambulatory Visit (INDEPENDENT_AMBULATORY_CARE_PROVIDER_SITE_OTHER): Payer: Medicare Other | Admitting: *Deleted

## 2016-07-08 DIAGNOSIS — Z5181 Encounter for therapeutic drug level monitoring: Secondary | ICD-10-CM

## 2016-07-08 DIAGNOSIS — I4891 Unspecified atrial fibrillation: Secondary | ICD-10-CM

## 2016-07-08 LAB — POCT INR: INR: 2.2

## 2016-08-05 ENCOUNTER — Ambulatory Visit (INDEPENDENT_AMBULATORY_CARE_PROVIDER_SITE_OTHER): Payer: Medicare Other | Admitting: *Deleted

## 2016-08-05 DIAGNOSIS — I4891 Unspecified atrial fibrillation: Secondary | ICD-10-CM

## 2016-08-05 DIAGNOSIS — Z5181 Encounter for therapeutic drug level monitoring: Secondary | ICD-10-CM | POA: Diagnosis not present

## 2016-08-05 LAB — POCT INR: INR: 2.9

## 2016-09-02 ENCOUNTER — Ambulatory Visit (INDEPENDENT_AMBULATORY_CARE_PROVIDER_SITE_OTHER): Payer: Medicare Other

## 2016-09-02 DIAGNOSIS — I4891 Unspecified atrial fibrillation: Secondary | ICD-10-CM | POA: Diagnosis not present

## 2016-09-02 DIAGNOSIS — Z5181 Encounter for therapeutic drug level monitoring: Secondary | ICD-10-CM

## 2016-09-02 LAB — POCT INR: INR: 2.4

## 2016-09-30 ENCOUNTER — Ambulatory Visit (INDEPENDENT_AMBULATORY_CARE_PROVIDER_SITE_OTHER): Payer: Medicare Other | Admitting: *Deleted

## 2016-09-30 DIAGNOSIS — Z5181 Encounter for therapeutic drug level monitoring: Secondary | ICD-10-CM

## 2016-09-30 DIAGNOSIS — I4891 Unspecified atrial fibrillation: Secondary | ICD-10-CM

## 2016-09-30 LAB — POCT INR: INR: 2.1

## 2016-09-30 MED ORDER — WARFARIN SODIUM 5 MG PO TABS: ORAL_TABLET | ORAL | 3 refills | Status: DC

## 2016-11-01 ENCOUNTER — Ambulatory Visit (INDEPENDENT_AMBULATORY_CARE_PROVIDER_SITE_OTHER): Payer: Medicare Other

## 2016-11-01 DIAGNOSIS — I4891 Unspecified atrial fibrillation: Secondary | ICD-10-CM | POA: Diagnosis not present

## 2016-11-01 DIAGNOSIS — Z5181 Encounter for therapeutic drug level monitoring: Secondary | ICD-10-CM

## 2016-11-01 LAB — POCT INR: INR: 2.8

## 2016-11-30 ENCOUNTER — Ambulatory Visit (INDEPENDENT_AMBULATORY_CARE_PROVIDER_SITE_OTHER): Payer: Medicare Other

## 2016-11-30 DIAGNOSIS — Z5181 Encounter for therapeutic drug level monitoring: Secondary | ICD-10-CM | POA: Diagnosis not present

## 2016-11-30 DIAGNOSIS — I4891 Unspecified atrial fibrillation: Secondary | ICD-10-CM

## 2016-11-30 LAB — POCT INR: INR: 3.2

## 2016-12-17 ENCOUNTER — Other Ambulatory Visit: Payer: Self-pay | Admitting: Cardiology

## 2016-12-28 ENCOUNTER — Ambulatory Visit (INDEPENDENT_AMBULATORY_CARE_PROVIDER_SITE_OTHER): Payer: Medicare Other

## 2016-12-28 DIAGNOSIS — Z5181 Encounter for therapeutic drug level monitoring: Secondary | ICD-10-CM

## 2016-12-28 DIAGNOSIS — I4891 Unspecified atrial fibrillation: Secondary | ICD-10-CM | POA: Diagnosis not present

## 2016-12-28 LAB — POCT INR: INR: 3.3

## 2017-01-18 ENCOUNTER — Encounter (INDEPENDENT_AMBULATORY_CARE_PROVIDER_SITE_OTHER): Payer: Self-pay

## 2017-01-18 ENCOUNTER — Ambulatory Visit (INDEPENDENT_AMBULATORY_CARE_PROVIDER_SITE_OTHER): Payer: Medicare Other | Admitting: Cardiology

## 2017-01-18 ENCOUNTER — Encounter: Payer: Self-pay | Admitting: Cardiology

## 2017-01-18 VITALS — BP 128/82 | HR 74 | Ht 70.0 in | Wt 303.0 lb

## 2017-01-18 DIAGNOSIS — Z789 Other specified health status: Secondary | ICD-10-CM

## 2017-01-18 DIAGNOSIS — E7800 Pure hypercholesterolemia, unspecified: Secondary | ICD-10-CM

## 2017-01-18 DIAGNOSIS — I10 Essential (primary) hypertension: Secondary | ICD-10-CM

## 2017-01-18 DIAGNOSIS — I482 Chronic atrial fibrillation: Secondary | ICD-10-CM

## 2017-01-18 DIAGNOSIS — I4821 Permanent atrial fibrillation: Secondary | ICD-10-CM

## 2017-01-18 NOTE — Progress Notes (Signed)
[address] [place], [place]  [address] Phone: [phone] Fax:  [phone]  Date:  01/18/2017   ID:  [name], [name] 05/08/1946, MRN [id number]  PCP:  Gaye Alken, MD   History of Present Illness: [name] [name] is a 71 y.o. male with hypertension, hyperlipidemia with atrial fibrillation here for 45-month followup.   Atrial fibrillation was in the setting of cholecystitis in May of 2013, however upon return visits, he was noted to be in persistent atrial fibrillation that was asymptomatic.. Currently taking anticoagulation, no issues, no bleeding. Normal ejection fraction, trace aortic insufficiency noted on echocardiogram. Well rate controlled.  Currently doing well. No chest pain, no shortness of breath. No syncope, no bleeding.   He still struggling with his obesity. We discussed at length.  He also had trouble in the past with statin intolerance  [name], wife retired Engineer, civil (consulting), is a patient of mine.   01/18/17-experienced shortness of breath while walking [facility]. He usually does this once a year. He does not experience any shortness breath with his normal daily activities. No chest pain, no syncope. He does have some easy bruising at times with Coumadin. Tolerating other medications well. He is trying to avoid carbohydrates. He does drink 0-calorie sweetened tea  Wt Readings from Last 3 Encounters:  01/18/17 (!) 303 lb (137.4 kg)  01/14/16 (!) 304 lb 1.9 oz (137.9 kg)  10/25/14 287 lb (130.2 kg)     Past Medical History:  Diagnosis Date  . A-fib (HCC)   . DDD (degenerative disc disease)   . High cholesterol   . Hypertension   . Obesity     Past Surgical History:  Procedure Laterality Date  . APPENDECTOMY    . CHOLECYSTECTOMY  02/19/2012   Procedure: LAPAROSCOPIC CHOLECYSTECTOMY WITH INTRAOPERATIVE CHOLANGIOGRAM;  Surgeon: Kandis Cocking, MD;  Location: [facility];  Service: General;  Laterality: N/A;  . HERNIA REPAIR        Current Outpatient Prescriptions  Medication Sig Dispense Refill  . lisinopril (PRINIVIL,ZESTRIL) 10 MG tablet Take 10 mg by mouth daily.    . metoprolol (LOPRESSOR) 50 MG tablet Take 1 tablet (50 mg total) by mouth 2 (two) times daily. 60 tablet 11  . warfarin (COUMADIN) 5 MG tablet TAKE AS DIRECTED BY COUMADIN CLINIC 45 tablet 3   No current facility-administered medications for this visit.     Allergies:    Allergies  Allergen Reactions  . Crestor [Rosuvastatin]     Muscle aches, leg cramps   . Statins Other (See Comments)    Liver enzymes   . Welchol [Colesevelam Hcl]     Increased liver functions    Social History:  The patient  reports that he quit smoking about 42 years ago. His smoking use included Cigarettes. He has never used smokeless tobacco. He reports that he drinks about 1.2 oz of alcohol per week . He reports that he does not use drugs.   ROS:  Please see the history of present illness.  Unless specified above all other review of systems negative.  PHYSICAL EXAM: VS:  BP 128/82   Pulse 74   Ht  (1.778 m)   Wt (!) 303 lb (137.4 kg)   BMI 43.48 kg/m  GEN: Well nourished, well developed, in no acute distress  HEENT: normal  Neck: no JVD, carotid bruits, or masses Cardiac: irreg irreg; no murmurs, rubs, or gallops,no edema  Respiratory:  clear to auscultation  bilaterally, normal work of breathing GI: soft, nontender, nondistended, + BS obese MS: no deformity or atrophy  Skin: warm and dry, no rash Neuro:  Alert and Oriented x 3, Strength and sensation are intact Psych: euthymic mood, full affect   EKG: EKG today 01/18/17 shows atrial fibrillation heart rate 74 with nonspecific T-wave changes personally viewed-prior 10/25/14-atrial fibrillation, heart rate 73, T-wave inversion V2 as before- Atrial fibrillation rate 79 with T-wave inversion in V2, V3, no significant changes from prior.     ASSESSMENT AND PLAN:  Permanent atrial fibrillation  - Doing  well, continue rate control.  Morbid obesity  - Continue to encourage weight loss, decrease carbohydrates. We discussed trying to drink water and not beverages that have sweetener in them.   Essential hypertension  - Continue lisinopril. Doing well.  Hyperlipidemia  - Fish oil has helped with triglycerides in the past. Continue with weight loss.  Statin intolerance  - Has had prior knee, shoulder aches. When he stopped his statin, all of these symptoms went away. He's even tried WelChol in the past but this elevated his liver enzymes. He did not qualify for injectable.  One year follow  Signed, Donato Schultz, MD [facility]  01/18/2017 11:22 AM

## 2017-01-18 NOTE — Patient Instructions (Signed)

## 2017-01-25 ENCOUNTER — Other Ambulatory Visit: Payer: Self-pay | Admitting: *Deleted

## 2017-01-25 ENCOUNTER — Ambulatory Visit (INDEPENDENT_AMBULATORY_CARE_PROVIDER_SITE_OTHER): Payer: Medicare Other | Admitting: *Deleted

## 2017-01-25 DIAGNOSIS — I4891 Unspecified atrial fibrillation: Secondary | ICD-10-CM | POA: Diagnosis not present

## 2017-01-25 DIAGNOSIS — Z5181 Encounter for therapeutic drug level monitoring: Secondary | ICD-10-CM | POA: Diagnosis not present

## 2017-01-25 LAB — POCT INR: INR: 3

## 2017-01-25 MED ORDER — METOPROLOL TARTRATE 50 MG PO TABS: 50.0000 mg | ORAL_TABLET | Freq: Two times a day (BID) | ORAL | 3 refills | Status: DC

## 2017-01-25 MED ORDER — WARFARIN SODIUM 5 MG PO TABS: ORAL_TABLET | ORAL | 3 refills | Status: DC

## 2017-02-22 ENCOUNTER — Encounter (INDEPENDENT_AMBULATORY_CARE_PROVIDER_SITE_OTHER): Payer: Self-pay

## 2017-02-22 ENCOUNTER — Ambulatory Visit (INDEPENDENT_AMBULATORY_CARE_PROVIDER_SITE_OTHER): Payer: Medicare Other | Admitting: *Deleted

## 2017-02-22 DIAGNOSIS — Z5181 Encounter for therapeutic drug level monitoring: Secondary | ICD-10-CM | POA: Diagnosis not present

## 2017-02-22 DIAGNOSIS — I4891 Unspecified atrial fibrillation: Secondary | ICD-10-CM

## 2017-02-22 LAB — POCT INR: INR: 2.8

## 2017-03-22 ENCOUNTER — Ambulatory Visit (INDEPENDENT_AMBULATORY_CARE_PROVIDER_SITE_OTHER): Payer: Medicare Other | Admitting: *Deleted

## 2017-03-22 DIAGNOSIS — I4891 Unspecified atrial fibrillation: Secondary | ICD-10-CM | POA: Diagnosis not present

## 2017-03-22 DIAGNOSIS — Z5181 Encounter for therapeutic drug level monitoring: Secondary | ICD-10-CM

## 2017-03-22 LAB — POCT INR: INR: 2.2

## 2017-04-19 ENCOUNTER — Ambulatory Visit (INDEPENDENT_AMBULATORY_CARE_PROVIDER_SITE_OTHER): Payer: Medicare Other | Admitting: *Deleted

## 2017-04-19 DIAGNOSIS — I4891 Unspecified atrial fibrillation: Secondary | ICD-10-CM

## 2017-04-19 DIAGNOSIS — Z5181 Encounter for therapeutic drug level monitoring: Secondary | ICD-10-CM

## 2017-04-19 LAB — POCT INR: INR: 2.8

## 2017-05-17 ENCOUNTER — Ambulatory Visit (INDEPENDENT_AMBULATORY_CARE_PROVIDER_SITE_OTHER): Payer: Medicare Other | Admitting: Pharmacist

## 2017-05-17 DIAGNOSIS — I4891 Unspecified atrial fibrillation: Secondary | ICD-10-CM

## 2017-05-17 DIAGNOSIS — Z5181 Encounter for therapeutic drug level monitoring: Secondary | ICD-10-CM | POA: Diagnosis not present

## 2017-05-17 LAB — POCT INR: INR: 2.9

## 2017-05-19 ENCOUNTER — Other Ambulatory Visit: Payer: Self-pay | Admitting: *Deleted

## 2017-05-19 MED ORDER — WARFARIN SODIUM 5 MG PO TABS: ORAL_TABLET | ORAL | 0 refills | Status: DC

## 2017-05-19 NOTE — Telephone Encounter (Signed)
Pharmacy requests ninety days. 

## 2017-06-14 ENCOUNTER — Ambulatory Visit (INDEPENDENT_AMBULATORY_CARE_PROVIDER_SITE_OTHER): Payer: Medicare Other | Admitting: *Deleted

## 2017-06-14 DIAGNOSIS — Z5181 Encounter for therapeutic drug level monitoring: Secondary | ICD-10-CM

## 2017-06-14 DIAGNOSIS — I482 Chronic atrial fibrillation: Secondary | ICD-10-CM

## 2017-06-14 DIAGNOSIS — I4891 Unspecified atrial fibrillation: Secondary | ICD-10-CM

## 2017-06-14 DIAGNOSIS — I4821 Permanent atrial fibrillation: Secondary | ICD-10-CM

## 2017-06-14 LAB — POCT INR: INR: 1.8

## 2017-07-12 ENCOUNTER — Encounter (INDEPENDENT_AMBULATORY_CARE_PROVIDER_SITE_OTHER): Payer: Self-pay

## 2017-07-12 ENCOUNTER — Ambulatory Visit (INDEPENDENT_AMBULATORY_CARE_PROVIDER_SITE_OTHER): Payer: Medicare Other | Admitting: *Deleted

## 2017-07-12 DIAGNOSIS — I4891 Unspecified atrial fibrillation: Secondary | ICD-10-CM

## 2017-07-12 DIAGNOSIS — Z5181 Encounter for therapeutic drug level monitoring: Secondary | ICD-10-CM | POA: Diagnosis not present

## 2017-07-12 DIAGNOSIS — I482 Chronic atrial fibrillation: Secondary | ICD-10-CM | POA: Diagnosis not present

## 2017-07-12 DIAGNOSIS — I4821 Permanent atrial fibrillation: Secondary | ICD-10-CM

## 2017-07-12 LAB — POCT INR: INR: 2.6

## 2017-08-15 ENCOUNTER — Other Ambulatory Visit: Payer: Self-pay | Admitting: *Deleted

## 2017-08-15 MED ORDER — WARFARIN SODIUM 5 MG PO TABS: ORAL_TABLET | ORAL | 1 refills | Status: DC

## 2017-08-16 ENCOUNTER — Ambulatory Visit (INDEPENDENT_AMBULATORY_CARE_PROVIDER_SITE_OTHER): Payer: Medicare Other

## 2017-08-16 DIAGNOSIS — Z5181 Encounter for therapeutic drug level monitoring: Secondary | ICD-10-CM

## 2017-08-16 DIAGNOSIS — I4891 Unspecified atrial fibrillation: Secondary | ICD-10-CM

## 2017-08-16 LAB — POCT INR: INR: 3.1

## 2017-08-16 NOTE — Patient Instructions (Signed)
Take 1/2 tablet today, then resume same dosage 1 tablet everyday except 1.5 tablets on Mondays.  Recheck INR in 4 weeks.

## 2017-09-26 ENCOUNTER — Ambulatory Visit (INDEPENDENT_AMBULATORY_CARE_PROVIDER_SITE_OTHER): Payer: Medicare Other | Admitting: *Deleted

## 2017-09-26 ENCOUNTER — Encounter (INDEPENDENT_AMBULATORY_CARE_PROVIDER_SITE_OTHER): Payer: Self-pay

## 2017-09-26 DIAGNOSIS — Z5181 Encounter for therapeutic drug level monitoring: Secondary | ICD-10-CM | POA: Diagnosis not present

## 2017-09-26 DIAGNOSIS — I4891 Unspecified atrial fibrillation: Secondary | ICD-10-CM | POA: Diagnosis not present

## 2017-09-26 LAB — POCT INR: INR: 2.7

## 2017-09-26 NOTE — Patient Instructions (Addendum)
Description   Continue taking same dosage 1 tablet everyday except 1.5 tablets on Mondays.  Recheck INR in 6 weeks.

## 2017-10-31 ENCOUNTER — Ambulatory Visit (INDEPENDENT_AMBULATORY_CARE_PROVIDER_SITE_OTHER): Payer: Medicare Other

## 2017-10-31 DIAGNOSIS — Z5181 Encounter for therapeutic drug level monitoring: Secondary | ICD-10-CM | POA: Diagnosis not present

## 2017-10-31 DIAGNOSIS — I4891 Unspecified atrial fibrillation: Secondary | ICD-10-CM | POA: Diagnosis not present

## 2017-10-31 LAB — POCT INR: INR: 3.6

## 2017-10-31 NOTE — Patient Instructions (Signed)
  Description   Skip today's dosage of Coumadin, then resume same dosage 1 tablet everyday except 1.5 tablets on Mondays.  Recheck INR in 3 weeks.

## 2017-11-21 ENCOUNTER — Ambulatory Visit (INDEPENDENT_AMBULATORY_CARE_PROVIDER_SITE_OTHER): Payer: Medicare Other | Admitting: *Deleted

## 2017-11-21 DIAGNOSIS — Z5181 Encounter for therapeutic drug level monitoring: Secondary | ICD-10-CM | POA: Diagnosis not present

## 2017-11-21 DIAGNOSIS — I4891 Unspecified atrial fibrillation: Secondary | ICD-10-CM | POA: Diagnosis not present

## 2017-11-21 LAB — POCT INR: INR: 1.7

## 2017-11-21 NOTE — Patient Instructions (Signed)
Description   Today March 4th take 2 tablets (10mg ) then continue  same dosage 1 tablet everyday except 1.5 tablets on Mondays.  Recheck INR in 3 weeks.

## 2017-12-12 ENCOUNTER — Ambulatory Visit (INDEPENDENT_AMBULATORY_CARE_PROVIDER_SITE_OTHER): Payer: Medicare Other | Admitting: *Deleted

## 2017-12-12 DIAGNOSIS — I4891 Unspecified atrial fibrillation: Secondary | ICD-10-CM

## 2017-12-12 DIAGNOSIS — Z5181 Encounter for therapeutic drug level monitoring: Secondary | ICD-10-CM | POA: Diagnosis not present

## 2017-12-12 LAB — POCT INR: INR: 1.6

## 2017-12-12 NOTE — Patient Instructions (Signed)
Description   Today take 2 tablets, then start taking  1 tablet everyday except 1.5 tablets on Mondays and Fridays.   Recheck INR in 2 weeks.

## 2017-12-26 ENCOUNTER — Ambulatory Visit (INDEPENDENT_AMBULATORY_CARE_PROVIDER_SITE_OTHER): Payer: Medicare Other | Admitting: *Deleted

## 2017-12-26 ENCOUNTER — Encounter (INDEPENDENT_AMBULATORY_CARE_PROVIDER_SITE_OTHER): Payer: Self-pay

## 2017-12-26 DIAGNOSIS — Z5181 Encounter for therapeutic drug level monitoring: Secondary | ICD-10-CM | POA: Diagnosis not present

## 2017-12-26 DIAGNOSIS — I4891 Unspecified atrial fibrillation: Secondary | ICD-10-CM

## 2017-12-26 LAB — POCT INR: INR: 1.7

## 2017-12-26 NOTE — Patient Instructions (Signed)
Description   Today  April 8th take 2 tablets, then start taking  1 tablet everyday except 1.5 tablets on Mondays Wednesdays  and Fridays.   Recheck INR in 2 weeks.

## 2018-01-09 ENCOUNTER — Ambulatory Visit (INDEPENDENT_AMBULATORY_CARE_PROVIDER_SITE_OTHER): Payer: Medicare Other | Admitting: *Deleted

## 2018-01-09 DIAGNOSIS — I4891 Unspecified atrial fibrillation: Secondary | ICD-10-CM

## 2018-01-09 DIAGNOSIS — Z5181 Encounter for therapeutic drug level monitoring: Secondary | ICD-10-CM | POA: Diagnosis not present

## 2018-01-09 LAB — POCT INR: INR: 1.9

## 2018-01-09 NOTE — Patient Instructions (Signed)
Description   Today take 2 tablets, then change your dose to 1.5 tablets daily except 1 tablet on Tuesdays, Thursdays, and Saturdays.  Recheck INR in 2 weeks.

## 2018-01-27 ENCOUNTER — Encounter: Payer: Self-pay | Admitting: Cardiology

## 2018-01-27 ENCOUNTER — Ambulatory Visit (INDEPENDENT_AMBULATORY_CARE_PROVIDER_SITE_OTHER): Payer: Medicare Other | Admitting: *Deleted

## 2018-01-27 ENCOUNTER — Ambulatory Visit (INDEPENDENT_AMBULATORY_CARE_PROVIDER_SITE_OTHER): Payer: Medicare Other | Admitting: Cardiology

## 2018-01-27 VITALS — BP 110/78 | HR 69 | Ht 70.0 in | Wt 291.6 lb

## 2018-01-27 DIAGNOSIS — I482 Chronic atrial fibrillation: Secondary | ICD-10-CM | POA: Diagnosis not present

## 2018-01-27 DIAGNOSIS — E7800 Pure hypercholesterolemia, unspecified: Secondary | ICD-10-CM | POA: Diagnosis not present

## 2018-01-27 DIAGNOSIS — Z789 Other specified health status: Secondary | ICD-10-CM | POA: Diagnosis not present

## 2018-01-27 DIAGNOSIS — I4891 Unspecified atrial fibrillation: Secondary | ICD-10-CM

## 2018-01-27 DIAGNOSIS — I10 Essential (primary) hypertension: Secondary | ICD-10-CM | POA: Diagnosis not present

## 2018-01-27 DIAGNOSIS — I4821 Permanent atrial fibrillation: Secondary | ICD-10-CM

## 2018-01-27 DIAGNOSIS — Z5181 Encounter for therapeutic drug level monitoring: Secondary | ICD-10-CM

## 2018-01-27 LAB — POCT INR: INR: 2.2

## 2018-01-27 MED ORDER — METOPROLOL TARTRATE 50 MG PO TABS: 50.0000 mg | ORAL_TABLET | Freq: Two times a day (BID) | ORAL | 3 refills | Status: DC

## 2018-01-27 NOTE — Patient Instructions (Signed)

## 2018-01-27 NOTE — Patient Instructions (Signed)
Description   Continue taking 1.5 tablets daily except 1 tablet on Tuesdays, Thursdays, and Saturdays.  Recheck INR in 3 weeks.

## 2018-01-27 NOTE — Progress Notes (Signed)
[address] [place], [place]  [address] Phone: [phone] Fax:  [phone]  Date:  01/27/2018   ID:  [name], [name] [birth year], MRN [id number]  PCP:  Juluis Rainier, MD   History of Present Illness: [name] [name] is a 72 y.o. male with hypertension, hyperlipidemia with atrial fibrillation here for 17-month followup.   Atrial fibrillation was in the setting of cholecystitis in May of 2013, however upon return visits, he was noted to be in persistent atrial fibrillation that was asymptomatic.. Currently taking anticoagulation, no issues, no bleeding. Normal ejection fraction, trace aortic insufficiency noted on echocardiogram. Well rate controlled.  Currently doing well. No chest pain, no shortness of breath. No syncope, no bleeding.   He still struggling with his obesity. We discussed at length.  He also had trouble in the past with statin intolerance  [name], wife retired Engineer, civil (consulting), is a patient of mine.   01/18/17-experienced shortness of breath while walking [facility]. He usually does this once a year. He does not experience any shortness breath with his normal daily activities. No chest pain, no syncope. He does have some easy bruising at times with Coumadin. Tolerating other medications well. He is trying to avoid carbohydrates. He does drink 0-calorie sweetened tea  01/27/2018-overall doing great, no complaints no bleeding syncope orthopnea PND.  Feels well, no changes in symptoms.  Having some trouble maintaining therapeutic INR.  Wt Readings from Last 3 Encounters:  01/27/18 291 lb 9.6 oz (132.3 kg)  01/18/17 (!) 303 lb (137.4 kg)  01/14/16 (!) 304 lb 1.9 oz (137.9 kg)     Past Medical History:  Diagnosis Date  . A-fib (HCC)   . DDD (degenerative disc disease)   . High cholesterol   . Hypertension   . Obesity     Past Surgical History:  Procedure Laterality Date  . APPENDECTOMY    . CHOLECYSTECTOMY  02/19/2012   Procedure:  LAPAROSCOPIC CHOLECYSTECTOMY WITH INTRAOPERATIVE CHOLANGIOGRAM;  Surgeon: Kandis Cocking, MD;  Location: [facility];  Service: General;  Laterality: N/A;  . HERNIA REPAIR      Current Outpatient Medications  Medication Sig Dispense Refill  . lisinopril (PRINIVIL,ZESTRIL) 10 MG tablet Take 10 mg by mouth daily.    . metoprolol tartrate (LOPRESSOR) 50 MG tablet Take 1 tablet (50 mg total) by mouth 2 (two) times daily. 180 tablet 3  . vitamin B-12 (CYANOCOBALAMIN) 1000 MCG tablet Take 1,000 mcg by mouth 2 (two) times daily.    Marland Kitchen warfarin (COUMADIN) 5 MG tablet Take as directed by coumadin clinic 120 tablet 1   No current facility-administered medications for this visit.     Allergies:    Allergies  Allergen Reactions  . Crestor [Rosuvastatin]     Muscle aches, leg cramps   . Statins Other (See Comments)    Liver enzymes   . Welchol [Colesevelam Hcl]     Increased liver functions    Social History:  The patient  reports that he quit smoking about 43 years ago. His smoking use included cigarettes. He has never used smokeless tobacco. He reports that he drinks about 1.2 oz of alcohol per week. He reports that he does not use drugs.   ROS:  Please see the history of present illness.  Unless above all other ROS neg.  PHYSICAL EXAM: VS:  BP 110/78   Pulse 69   Ht  (1.778 m)   Wt 291 lb 9.6 oz (  132.3 kg)   BMI 41.84 kg/m  GEN: Well nourished, well developed, in no acute distress overweight HEENT: normal  Neck: no JVD, carotid bruits, or masses Cardiac: irreg irreg; no murmurs, rubs, or gallops,no edema  Respiratory:  clear to auscultation bilaterally, normal work of breathing GI: soft, nontender, nondistended, + BS MS: no deformity or atrophy  Skin: warm and dry, no rash Neuro:  Alert and Oriented x 3, Strength and sensation are intact Psych: euthymic mood, full affect    EKG: EKG today 01/27/2018 -heart rate 69 bpm atrial fibrillation no other changes personally viewed  01/18/17 shows atrial fibrillation heart rate 74 with nonspecific T-wave changes personally viewed-prior 10/25/14-atrial fibrillation, heart rate 73, T-wave inversion V2 as before- Atrial fibrillation rate 79 with T-wave inversion in V2, V3, no significant changes from prior.     ASSESSMENT AND PLAN:  Permanent atrial fibrillation  - Doing well, continue rate control.  No changes made.  Doing very well.  Wants to continue with Coumadin.  He is priced out Xarelto and Eliquis in the past.  Too expensive.  Overall having some difficulty maintaining therapeutic INR.  Encouraged him to once again talk with his insurer about the possibility of Eliquis or Xarelto.  Morbid obesity  - Continue to encourage weight loss, decrease carbohydrates. We discussed trying to drink water and not beverages that have sweetener in them.  Lost about 10 to 12 pounds.  Continue to work on this.  Excellent.  Essential hypertension  - Continue lisinopril. Doing well.  Well controlled.  Metoprolol also helping.  Hyperlipidemia  - Fish oil has helped with triglycerides in the past. Continue with weight loss.  Cannot tolerate statins.  Tried them all.  Statin intolerance  - Has had prior knee, shoulder aches. When he stopped his statin, all of these symptoms went away. He's even tried WelChol in the past but this elevated his liver enzymes. He did not qualify for injectable.  One year follow  Signed, Donato Schultz, MD [facility]  01/27/2018 8:18 AM

## 2018-02-20 ENCOUNTER — Encounter (INDEPENDENT_AMBULATORY_CARE_PROVIDER_SITE_OTHER): Payer: Self-pay

## 2018-02-20 ENCOUNTER — Ambulatory Visit (INDEPENDENT_AMBULATORY_CARE_PROVIDER_SITE_OTHER): Payer: Medicare Other | Admitting: *Deleted

## 2018-02-20 DIAGNOSIS — Z5181 Encounter for therapeutic drug level monitoring: Secondary | ICD-10-CM

## 2018-02-20 DIAGNOSIS — I4891 Unspecified atrial fibrillation: Secondary | ICD-10-CM

## 2018-02-20 DIAGNOSIS — I482 Chronic atrial fibrillation: Secondary | ICD-10-CM | POA: Diagnosis not present

## 2018-02-20 DIAGNOSIS — I4821 Permanent atrial fibrillation: Secondary | ICD-10-CM

## 2018-02-20 LAB — POCT INR: INR: 4 — AB (ref 2.0–3.0)

## 2018-02-20 NOTE — Patient Instructions (Signed)
Description   Do not take coumadin today June 3rd then continue taking 1.5 tablets daily except 1 tablet on Tuesdays, Thursdays, and Saturdays.  Recheck INR in 2 weeks.

## 2018-03-06 ENCOUNTER — Ambulatory Visit (INDEPENDENT_AMBULATORY_CARE_PROVIDER_SITE_OTHER): Payer: Medicare Other | Admitting: *Deleted

## 2018-03-06 DIAGNOSIS — I4891 Unspecified atrial fibrillation: Secondary | ICD-10-CM | POA: Diagnosis not present

## 2018-03-06 DIAGNOSIS — I4821 Permanent atrial fibrillation: Secondary | ICD-10-CM

## 2018-03-06 DIAGNOSIS — Z5181 Encounter for therapeutic drug level monitoring: Secondary | ICD-10-CM | POA: Diagnosis not present

## 2018-03-06 LAB — POCT INR: INR: 3.7 — AB (ref 2.0–3.0)

## 2018-03-06 NOTE — Patient Instructions (Signed)
Description   Do not take coumadin today start taking 1 tablet daily except 1.5 tablets on Mondays, Wednesdays, Fridays.  Recheck INR in 2 weeks.

## 2018-03-20 ENCOUNTER — Ambulatory Visit (INDEPENDENT_AMBULATORY_CARE_PROVIDER_SITE_OTHER): Payer: Medicare Other | Admitting: *Deleted

## 2018-03-20 DIAGNOSIS — I4891 Unspecified atrial fibrillation: Secondary | ICD-10-CM

## 2018-03-20 DIAGNOSIS — Z5181 Encounter for therapeutic drug level monitoring: Secondary | ICD-10-CM | POA: Diagnosis not present

## 2018-03-20 DIAGNOSIS — I482 Chronic atrial fibrillation: Secondary | ICD-10-CM

## 2018-03-20 DIAGNOSIS — I4821 Permanent atrial fibrillation: Secondary | ICD-10-CM

## 2018-03-20 LAB — POCT INR: INR: 2.2 (ref 2.0–3.0)

## 2018-03-20 MED ORDER — WARFARIN SODIUM 5 MG PO TABS
ORAL_TABLET | ORAL | 0 refills | Status: DC
Start: 2018-03-20 — End: 2018-09-07

## 2018-03-20 NOTE — Patient Instructions (Signed)
Description   Continue taking 1 tablet daily except 1.5 tablets on Mondays, Wednesdays, Fridays.  Recheck INR in 3 weeks.

## 2018-04-13 ENCOUNTER — Ambulatory Visit (INDEPENDENT_AMBULATORY_CARE_PROVIDER_SITE_OTHER): Payer: Medicare Other | Admitting: Pharmacist

## 2018-04-13 DIAGNOSIS — I482 Chronic atrial fibrillation: Secondary | ICD-10-CM

## 2018-04-13 DIAGNOSIS — Z5181 Encounter for therapeutic drug level monitoring: Secondary | ICD-10-CM | POA: Diagnosis not present

## 2018-04-13 DIAGNOSIS — I4821 Permanent atrial fibrillation: Secondary | ICD-10-CM

## 2018-04-13 LAB — POCT INR: INR: 2.9 (ref 2.0–3.0)

## 2018-04-13 NOTE — Patient Instructions (Signed)
Description   Continue taking 1 tablet daily except 1.5 tablets on Mondays, Wednesdays, Fridays.  Recheck INR in 5 weeks.

## 2018-05-18 ENCOUNTER — Ambulatory Visit (INDEPENDENT_AMBULATORY_CARE_PROVIDER_SITE_OTHER): Payer: Medicare Other

## 2018-05-18 ENCOUNTER — Encounter (INDEPENDENT_AMBULATORY_CARE_PROVIDER_SITE_OTHER): Payer: Self-pay

## 2018-05-18 DIAGNOSIS — Z5181 Encounter for therapeutic drug level monitoring: Secondary | ICD-10-CM | POA: Diagnosis not present

## 2018-05-18 DIAGNOSIS — I482 Chronic atrial fibrillation: Secondary | ICD-10-CM | POA: Diagnosis not present

## 2018-05-18 DIAGNOSIS — I4821 Permanent atrial fibrillation: Secondary | ICD-10-CM

## 2018-05-18 LAB — POCT INR: INR: 2.9 (ref 2.0–3.0)

## 2018-05-18 NOTE — Patient Instructions (Signed)
Description   Continue taking 1 tablet daily except 1.5 tablets on Mondays, Wednesdays, Fridays.  Recheck INR in 6 weeks.

## 2018-06-29 ENCOUNTER — Ambulatory Visit (INDEPENDENT_AMBULATORY_CARE_PROVIDER_SITE_OTHER): Payer: Medicare Other | Admitting: *Deleted

## 2018-06-29 DIAGNOSIS — Z5181 Encounter for therapeutic drug level monitoring: Secondary | ICD-10-CM

## 2018-06-29 DIAGNOSIS — I4821 Permanent atrial fibrillation: Secondary | ICD-10-CM | POA: Diagnosis not present

## 2018-06-29 LAB — POCT INR: INR: 3.2 — AB (ref 2.0–3.0)

## 2018-06-29 NOTE — Patient Instructions (Signed)
Description   Do not take any Coumadin today then continue taking 1 tablet daily except 1.5 tablets on Mondays, Wednesdays, Fridays.  Recheck INR in 5 weeks.

## 2018-08-03 ENCOUNTER — Ambulatory Visit (INDEPENDENT_AMBULATORY_CARE_PROVIDER_SITE_OTHER): Payer: Medicare Other

## 2018-08-03 DIAGNOSIS — I4821 Permanent atrial fibrillation: Secondary | ICD-10-CM

## 2018-08-03 DIAGNOSIS — Z5181 Encounter for therapeutic drug level monitoring: Secondary | ICD-10-CM | POA: Diagnosis not present

## 2018-08-03 LAB — POCT INR: INR: 4.3 — AB (ref 2.0–3.0)

## 2018-08-03 NOTE — Patient Instructions (Signed)
Description   Skip today's dosage of Coumadin, then start taking 1 tablet daily except 1.5 tablets on Mondays and Fridays.  Recheck INR in 2-3 weeks.

## 2018-08-24 ENCOUNTER — Ambulatory Visit (INDEPENDENT_AMBULATORY_CARE_PROVIDER_SITE_OTHER): Payer: Medicare Other

## 2018-08-24 DIAGNOSIS — Z5181 Encounter for therapeutic drug level monitoring: Secondary | ICD-10-CM | POA: Diagnosis not present

## 2018-08-24 DIAGNOSIS — I4821 Permanent atrial fibrillation: Secondary | ICD-10-CM | POA: Diagnosis not present

## 2018-08-24 LAB — POCT INR: INR: 3.6 — AB (ref 2.0–3.0)

## 2018-08-24 NOTE — Patient Instructions (Signed)
Description   Skip today's dosage of Coumadin, then start taking 1 tablet daily except 1.5 tablets on Mondays.  Recheck INR in 2 weeks.

## 2018-09-07 ENCOUNTER — Ambulatory Visit (INDEPENDENT_AMBULATORY_CARE_PROVIDER_SITE_OTHER): Payer: Medicare Other | Admitting: *Deleted

## 2018-09-07 DIAGNOSIS — I4821 Permanent atrial fibrillation: Secondary | ICD-10-CM

## 2018-09-07 DIAGNOSIS — Z5181 Encounter for therapeutic drug level monitoring: Secondary | ICD-10-CM

## 2018-09-07 LAB — POCT INR: INR: 3 (ref 2.0–3.0)

## 2018-09-07 MED ORDER — WARFARIN SODIUM 5 MG PO TABS: ORAL_TABLET | ORAL | 0 refills | Status: DC

## 2018-09-07 NOTE — Patient Instructions (Signed)
Description   Continue  taking 1 tablet daily except 1.5 tablets on Mondays.  Recheck INR in 2 weeks.

## 2018-09-16 ENCOUNTER — Other Ambulatory Visit: Payer: Self-pay | Admitting: Cardiology

## 2018-09-16 DIAGNOSIS — I4821 Permanent atrial fibrillation: Secondary | ICD-10-CM

## 2018-09-19 ENCOUNTER — Ambulatory Visit (INDEPENDENT_AMBULATORY_CARE_PROVIDER_SITE_OTHER): Payer: Medicare Other | Admitting: *Deleted

## 2018-09-19 DIAGNOSIS — I4821 Permanent atrial fibrillation: Secondary | ICD-10-CM | POA: Diagnosis not present

## 2018-09-19 DIAGNOSIS — Z5181 Encounter for therapeutic drug level monitoring: Secondary | ICD-10-CM

## 2018-09-19 LAB — POCT INR: INR: 2.5 (ref 2.0–3.0)

## 2018-09-19 NOTE — Patient Instructions (Signed)
Description   Continue taking 1 tablet daily except 1.5 tablets on Mondays.  Recheck INR in 4 weeks.     

## 2018-10-17 ENCOUNTER — Ambulatory Visit (INDEPENDENT_AMBULATORY_CARE_PROVIDER_SITE_OTHER): Payer: Medicare Other

## 2018-10-17 DIAGNOSIS — I4821 Permanent atrial fibrillation: Secondary | ICD-10-CM

## 2018-10-17 DIAGNOSIS — Z5181 Encounter for therapeutic drug level monitoring: Secondary | ICD-10-CM

## 2018-10-17 LAB — POCT INR: INR: 3.1 — AB (ref 2.0–3.0)

## 2018-10-17 NOTE — Patient Instructions (Signed)
Please have a large serving of greens today and continue taking 1 tablet daily except 1.5 tablets on Mondays.  Recheck INR in 4 weeks.

## 2018-11-14 ENCOUNTER — Ambulatory Visit (INDEPENDENT_AMBULATORY_CARE_PROVIDER_SITE_OTHER): Payer: Medicare Other | Admitting: *Deleted

## 2018-11-14 DIAGNOSIS — I4821 Permanent atrial fibrillation: Secondary | ICD-10-CM | POA: Diagnosis not present

## 2018-11-14 DIAGNOSIS — Z5181 Encounter for therapeutic drug level monitoring: Secondary | ICD-10-CM | POA: Diagnosis not present

## 2018-11-14 LAB — POCT INR: INR: 2.5 (ref 2.0–3.0)

## 2018-11-14 NOTE — Patient Instructions (Signed)
Description   Continue taking 1 tablet daily except 1.5 tablets on Mondays.  Recheck INR in 4 weeks.

## 2018-12-04 ENCOUNTER — Other Ambulatory Visit: Payer: Self-pay | Admitting: Cardiology

## 2018-12-04 DIAGNOSIS — I4821 Permanent atrial fibrillation: Secondary | ICD-10-CM

## 2018-12-08 ENCOUNTER — Telehealth: Payer: Self-pay | Admitting: Pharmacist

## 2018-12-08 NOTE — Telephone Encounter (Signed)
Patient screened due to COVID- patient states he has been sick for the past several days. Patient INR at last check was within range. Appointment rescheduled for 2 weeks out.

## 2018-12-21 ENCOUNTER — Telehealth: Payer: Self-pay

## 2018-12-21 NOTE — Telephone Encounter (Signed)
lmom for prescreen/drive thru 

## 2018-12-22 NOTE — Telephone Encounter (Signed)
2nd lmom for prescreen/drive thru 

## 2018-12-25 ENCOUNTER — Telehealth: Payer: Self-pay

## 2018-12-25 ENCOUNTER — Ambulatory Visit (INDEPENDENT_AMBULATORY_CARE_PROVIDER_SITE_OTHER): Payer: Medicare Other | Admitting: Pharmacist

## 2018-12-25 ENCOUNTER — Other Ambulatory Visit: Payer: Self-pay

## 2018-12-25 DIAGNOSIS — I4821 Permanent atrial fibrillation: Secondary | ICD-10-CM | POA: Diagnosis not present

## 2018-12-25 DIAGNOSIS — Z5181 Encounter for therapeutic drug level monitoring: Secondary | ICD-10-CM | POA: Diagnosis not present

## 2018-12-25 LAB — POCT INR: INR: 3.3 — AB (ref 2.0–3.0)

## 2018-12-25 NOTE — Telephone Encounter (Signed)
1. Do you currently have a fever? No (yes = cancel and refer to pcp for e-visit) 2. Have you recently travelled on a cruise, internationally, or to [place], [place], [place], [place], [place], or [place], [place] [employer]) ? No (yes = cancel, stay home, monitor symptoms, and contact pcp or initiate e-visit if symptoms develop) 3. Have you been in contact with someone that is currently pending confirmation of Covid19 testing or has been confirmed to have the Covid19 virus?  No (yes = cancel, stay home, away from tested individual, monitor symptoms, and contact pcp or initiate e-visit if symptoms develop) 4. Are you currently experiencing fatigue or cough? No (yes = pt should be prepared to have a mask placed at the time of their visit).  Pt. Advised that we are restricting visitors at this time and anyone present in the vehicle should meet the above criteria as well. Advised that visit will be at curbside for finger stick ONLY and will receive call with instructions. Pt also advised to please bring own pen for signature of arrival document.   Pt's wife aware pt to arrive at 12:45pm and of the drive thru clinic.

## 2019-01-12 ENCOUNTER — Telehealth: Payer: Self-pay

## 2019-01-12 NOTE — Telephone Encounter (Signed)
lmom for prescreen  

## 2019-01-15 ENCOUNTER — Ambulatory Visit (INDEPENDENT_AMBULATORY_CARE_PROVIDER_SITE_OTHER): Payer: Medicare Other | Admitting: Pharmacist

## 2019-01-15 ENCOUNTER — Other Ambulatory Visit: Payer: Self-pay

## 2019-01-15 DIAGNOSIS — I4821 Permanent atrial fibrillation: Secondary | ICD-10-CM

## 2019-01-15 DIAGNOSIS — Z5181 Encounter for therapeutic drug level monitoring: Secondary | ICD-10-CM | POA: Diagnosis not present

## 2019-01-15 LAB — POCT INR: INR: 3.6 — AB (ref 2.0–3.0)

## 2019-01-18 ENCOUNTER — Telehealth: Payer: Self-pay | Admitting: Cardiology

## 2019-01-18 NOTE — Telephone Encounter (Signed)
New message      Virtual video visit scheduled by wife on 01-24-19 at 11:45.  Wife has a visit at 54.  I did not talk to pt to get consent.

## 2019-01-22 NOTE — Progress Notes (Signed)
Virtual Visit via Video Note   This visit type was conducted due to national recommendations for restrictions regarding the COVID-19 Pandemic (e.g. social distancing) in an effort to limit this patient's exposure and mitigate transmission in our community.  Due to his co-morbid illnesses, this patient is at least at moderate risk for complications without adequate follow up.  This format is felt to be most appropriate for this patient at this time.  All issues noted in this document were discussed and addressed.  A limited physical exam was performed with this format.  Please refer to the patient's chart for his consent to telehealth for [facility].   Date:  01/24/2019   ID:  [name] [name], DOB 06-16-1946, MRN [id number]  Patient Location: Home Provider Location: Office  PCP:  Juluis Rainier, MD  Cardiologist:  Donato Schultz, MD  Electrophysiologist:  None   Evaluation Performed:  Follow-Up Visit 1 year follow up Chief Complaint:  Atrial fib   History of Present Illness:    [name] [name] is a 73 y.o. male with hypertension, hyperlipidemia and atrial fibrillation on coumadin.  Atrial fibrillation was in the setting of cholecystitis in May of 2013, however upon return visits, he was noted to be in persistent atrial fibrillation that was asymptomatic.. Currently taking anticoagulation, no issues, no bleeding. Normal ejection fraction, trace aortic insufficiency noted on echocardiogram. Well rate controlled.  He also had trouble in the past with statin intolerance  [name] [name], wife retired Engineer, civil (consulting), is being seen today as well.    01/18/17-experienced shortness of breath while walking [facility]. He usually does this once a year. He does not experience any shortness breath with his normal daily activities. No chest pain, no syncope. He does have some easy bruising at times with Coumadin. Tolerating other medications well. He is trying to avoid carbohydrates. He does drink  0-calorie sweetened tea  01/27/2018-overall doing great, no complaints no bleeding syncope orthopnea PND.  Feels well, no changes in symptoms.  Having some trouble maintaining therapeutic INR. But did not wish to change to Eliquis, too expensive.    Echo 2013 was fairly normal.   Today he is doing well, no fevres or coughs recently but did have cold in early March when wife had bronchitis.   He does not exercise, we discussed getting up and walking around the house more. His BP is stable, no chest pain, SOB, no swelling.  He does need med refills.  No blood in stools or urine.  He has coumadin check through our office. He has not really been out with social isolation but does know to wear a mask.     The patient does not have symptoms concerning for COVID-19 infection (fever, chills, cough, or new shortness of breath).    Past Medical History:  Diagnosis Date  . A-fib (HCC)   . DDD (degenerative disc disease)   . High cholesterol   . Hypertension   . Obesity    Past Surgical History:  Procedure Laterality Date  . APPENDECTOMY    . CHOLECYSTECTOMY  02/19/2012   Procedure: LAPAROSCOPIC CHOLECYSTECTOMY WITH INTRAOPERATIVE CHOLANGIOGRAM;  Surgeon: Kandis Cocking, MD;  Location: [facility];  Service: General;  Laterality: N/A;  . HERNIA REPAIR       Current Meds  Medication Sig  . lisinopril (PRINIVIL,ZESTRIL) 10 MG tablet Take 10 mg by mouth daily.  . metoprolol tartrate (LOPRESSOR) 50 MG tablet Take 1 tablet (50 mg total) by mouth 2 (two) times daily.  Marland Kitchen  vitamin B-12 (CYANOCOBALAMIN) 1000 MCG tablet Take 1,000 mcg by mouth 2 (two) times daily.  Marland Kitchen warfarin (COUMADIN) 5 MG tablet TAKE AS DIRECTED BY COUMADIN CLINIC  . [DISCONTINUED] metoprolol tartrate (LOPRESSOR) 50 MG tablet Take 1 tablet (50 mg total) by mouth 2 (two) times daily.     Allergies:   Crestor [rosuvastatin]; Statins; Welchol [colesevelam hcl]; and Zetia [ezetimibe]   Social History   Tobacco Use  . Smoking status:  Former Smoker    Types: Cigarettes    Last attempt to quit: 09/20/1974    Years since quitting: 44.3  . Smokeless tobacco: Never Used  Substance Use Topics  . Alcohol use: Yes    Alcohol/week: 2.0 standard drinks    Types: 2 Glasses of wine per week  . Drug use: No     Family Hx: The patient's family history includes Cancer in his sister.  ROS:   Please see the history of present illness.    General:no colds or fevers, no weight changes Skin:no rashes or ulcers HEENT:no blurred vision, no congestion CV:see HPI PUL:see HPI GI:no diarrhea constipation or melena, no indigestion GU:no hematuria, no dysuria MS:no joint pain, no claudication Neuro:no syncope, no lightheadedness Endo:no diabetes, no thyroid disease  All other systems reviewed and are negative.   Prior CV studies:   The following studies were reviewed today:  Echo 03/13/12 with EF 55% stable  Labs/Other Tests and Data Reviewed:    EKG:  An ECG dated 01/27/18 was personally reviewed today and demonstrated:  a fib rate controlled and no acute changes  Recent Labs: No results found for requested labs within last 8760 hours.   Recent Lipid Panel No results found for: CHOL, TRIG, HDL, CHOLHDL, LDLCALC, LDLDIRECT  Wt Readings from Last 3 Encounters:  01/24/19 291 lb 9.6 oz (132.3 kg)  01/27/18 291 lb 9.6 oz (132.3 kg)  01/18/17 (!) 303 lb (137.4 kg)     Objective:    Vital Signs:  BP 130/79   Pulse 63   Ht  (1.778 m)   Wt 291 lb 9.6 oz (132.3 kg)   BMI 41.84 kg/m    VITAL SIGNS:  reviewed  General healthy obese, male in NAD Neuro: A&O X 3 follows commands, + facial symmetry Lungs: can speak in complete sentences without becoming SOB Psych: pleasant affect  ASSESSMENT & PLAN:    1. permanent a fib rate controlled. No complaints.   2. Anticoagulation on coumadin followed by our office no bleeding complaints 3. HTN controlled continue BB and ACE 4. HLD - he does not tolerate statins, followed  by PCP 5. Morbid obesity, discussed walking around house more often.  Follow up with Dr. Anne Fu in 9 months - pt and wife very anxious to see Dr. Anne Fu.  COVID-19 Education: The signs and symptoms of COVID-19 were discussed with the patient and how to seek care for testing (follow up with PCP or arrange E-visit).  The importance of social distancing was discussed today.  Time:   Today, I have spent 10 minutes with the patient with telehealth technology discussing the above problems.     Medication Adjustments/Labs and Tests Ordered: Current medicines are reviewed at length with the patient today.  Concerns regarding medicines are outlined above.   Tests Ordered: No orders of the defined types were placed in this encounter.   Medication Changes: Meds ordered this encounter  Medications  . metoprolol tartrate (LOPRESSOR) 50 MG tablet    Sig: Take 1 tablet (50 mg  total) by mouth 2 (two) times daily.    Dispense:  180 tablet    Refill:  3    Disposition:  Follow up in 9 month(s)  Signed, Nada BoozerLaura Jefferey Lippmann, NP  01/24/2019 1:40 PM    [facility]

## 2019-01-22 NOTE — Telephone Encounter (Signed)
YOUR CARDIOLOGY TEAM HAS ARRANGED FOR AN E-VISIT FOR YOUR APPOINTMENT - PLEASE REVIEW IMPORTANT INFORMATION BELOW SEVERAL DAYS PRIOR TO YOUR APPOINTMENT  Due to the recent COVID-19 pandemic, we are transitioning in-person office visits to tele-medicine visits in an effort to decrease unnecessary exposure to our patients, their families, and staff. These visits are billed to your insurance just like a normal visit is. We also encourage you to sign up for MyChart if you have not already done so. You will need a smartphone if possible. For patients that do not have this, we can still complete the visit using a regular telephone but do prefer a smartphone to enable video when possible. You may have a family member that lives with you that can help. If possible, we also ask that you have a blood pressure cuff and scale at home to measure your blood pressure, heart rate and weight prior to your scheduled appointment. Patients with clinical needs that need an in-person evaluation and testing will still be able to come to the office if absolutely necessary. If you have any questions, feel free to call our office.    Virtual Visit Pre-Appointment Phone Call  "(Name), I am calling you today to discuss your upcoming appointment. We are currently trying to limit exposure to the virus that causes COVID-19 by seeing patients at home rather than in the office."  1. "What is the BEST phone number to call the day of the visit?" - include this in appointment notes  2. Do you have or have access to (through a family member/friend) a smartphone with video capability that we can use for your visit?" a. If yes - list this number in appt notes as cell (if different from BEST phone #) and list the appointment type as a VIDEO visit in appointment notes b. If no - list the appointment type as a PHONE visit in appointment notes  3. Confirm consent - "In the setting of the current Covid19 crisis, you are scheduled for a  (phone or video) visit with your provider on (date) at (time).  Just as we do with many in-office visits, in order for you to participate in this visit, we must obtain consent.  If you'd like, I can send this to your mychart (if signed up) or email for you to review.  Otherwise, I can obtain your verbal consent now.  All virtual visits are billed to your insurance company just like a normal visit would be.  By agreeing to a virtual visit, we'd like you to understand that the technology does not allow for your provider to perform an examination, and thus may limit your provider's ability to fully assess your condition. If your provider identifies any concerns that need to be evaluated in person, we will make arrangements to do so.  Finally, though the technology is pretty good, we cannot assure that it will always work on either your or our end, and in the setting of a video visit, we may have to convert it to a phone-only visit.  In either situation, we cannot ensure that we have a secure connection.  Are you willing to proceed?" STAFF: Did the patient verbally acknowledge consent to telehealth visit? Document YES/NO here: CONSENT GIVEN BY WIFE, DPR ON FILE.Marland Kitchen  4. Advise patient to be prepared - "Two hours prior to your appointment, go ahead and check your blood pressure, pulse, oxygen saturation, and your weight (if you have the equipment to check those) and write them all  down. When your visit starts, your provider will ask you for this information. If you have an Apple Watch or Kardia device, please plan to have heart rate information ready on the day of your appointment. Please have a pen and paper handy nearby the day of the visit as well."  5. Give patient instructions for MyChart download to smartphone OR Doximity/Doxy.me as below if video visit (depending on what platform provider is using)  6. Inform patient they will receive a phone call 15 minutes prior to their appointment time (may be from unknown  caller ID) so they should be prepared to answer    TELEPHONE CALL NOTE  [name] [name] has been deemed a candidate for a follow-up tele-health visit to limit community exposure during the Covid-19 pandemic. I spoke with the patient via phone to ensure availability of phone/video source, confirm preferred email & phone number, and discuss instructions and expectations.  I reminded [name] [name] to be prepared with any vital sign and/or heart rhythm information that could potentially be obtained via home monitoring, at the time of his visit. I reminded [name] [name] to expect a phone call prior to his visit.  [name], RMA 01/22/2019 10:29 AM   INSTRUCTIONS FOR DOWNLOADING THE MYCHART APP TO SMARTPHONE  - The patient must first make sure to have activated MyChart and know their login information - If Apple, go to [employer] and type in MyChart in the search bar and download the app. If Android, ask patient to go to [employer] and type in [place] in the search bar and download the app. The app is free but as with any other app downloads, their phone may require them to verify saved payment information or Apple/Android password.  - The patient will need to then log into the app with their MyChart username and password, and select [facility] as their healthcare provider to link the account. When it is time for your visit, go to the MyChart app, find appointments, and click Begin Video Visit. Be sure to Select Allow for your device to access the Microphone and Camera for your visit. You will then be connected, and your provider will be with you shortly.  **If they have any issues connecting, or need assistance please contact MyChart service desk [phone])**  **If using a computer, in order to ensure the best quality for their visit they will need to use either of the following Internet Browsers: [employer], or Google Chrome**  IF USING DOXIMITY or DOXY.ME - The  patient will receive a link just prior to their visit by text.     FULL LENGTH CONSENT FOR TELE-HEALTH VISIT   I hereby voluntarily request, consent and authorize [facility] and its employed or contracted physicians, physician assistants, nurse practitioners or other licensed health care professionals (the Practitioner), to provide me with telemedicine health care services (the Services") as deemed necessary by the treating Practitioner. I acknowledge and consent to receive the Services by the Practitioner via telemedicine. I understand that the telemedicine visit will involve communicating with the Practitioner through live audiovisual communication technology and the disclosure of certain medical information by electronic transmission. I acknowledge that I have been given the opportunity to request an in-person assessment or other available alternative prior to the telemedicine visit and am voluntarily participating in the telemedicine visit.  I understand that I have the right to withhold or withdraw my consent to the use of telemedicine in the course of  my care at any time, without affecting my right to future care or treatment, and that the Practitioner or I may terminate the telemedicine visit at any time. I understand that I have the right to inspect all information obtained and/or recorded in the course of the telemedicine visit and may receive copies of available information for a reasonable fee.  I understand that some of the potential risks of receiving the Services via telemedicine include:   Delay or interruption in medical evaluation due to technological equipment failure or disruption;  Information transmitted may not be sufficient (e.g. poor resolution of images) to allow for appropriate medical decision making by the Practitioner; and/or   In rare instances, security protocols could fail, causing a breach of personal health information.  Furthermore, I acknowledge that it is my  responsibility to provide information about my medical history, conditions and care that is complete and accurate to the best of my ability. I acknowledge that Practitioner's advice, recommendations, and/or decision may be based on factors not within their control, such as incomplete or inaccurate data provided by me or distortions of diagnostic images or specimens that may result from electronic transmissions. I understand that the practice of medicine is not an exact science and that Practitioner makes no warranties or guarantees regarding treatment outcomes. I acknowledge that I will receive a copy of this consent concurrently upon execution via email to the email address I last provided but may also request a printed copy by calling the office of [facility].    I understand that my insurance will be billed for this visit.   I have read or had this consent read to me.  I understand the contents of this consent, which adequately explains the benefits and risks of the Services being provided via telemedicine.   I have been provided ample opportunity to ask questions regarding this consent and the Services and have had my questions answered to my satisfaction.  I give my informed consent for the services to be provided through the use of telemedicine in my medical care  By participating in this telemedicine visit I agree to the above.    YOUR PROVIDER WILL BE USING THE FOLLOWING PLATFORM TO COMPLETE YOUR VISIT: DOXY.ME    IF USING MYCHART - How to Download the MyChart App to Your SmartPhone   - If Apple, go to [employer] and type in MyChart in the search bar and download the app. If Android, ask patient to go to [employer] and type in [place] in the search bar and download the app. The app is free but as with any other app downloads, your phone may require you to verify saved payment information or Apple/Android password.  - You will need to then log into the app with your MyChart  username and password, and select [place] as your healthcare provider to link the account.  - When it is time for your visit, go to the MyChart app, find appointments, and click Begin Video Visit. Be sure to Select Allow for your device to access the Microphone and Camera for your visit. You will then be connected, and your provider will be with you shortly.  **If you have any issues connecting or need assistance, please contact MyChart service desk [phone])**  **If using a computer, in order to ensure the best quality for your visit, you will need to use either of the following Internet Browsers: [employer] or [employer] Edge**   IF USING DOXIMITY  or DOXY.ME - The staff will give you instructions on receiving your link to join the meeting the day of your visit.      2-3 DAYS BEFORE YOUR APPOINTMENT  You will receive a telephone call from one of our [facility] team members - your caller ID may say "Unknown caller." If this is a video visit, we will walk you through how to get the video launched on your phone. We will remind you check your blood pressure, heart rate and weight prior to your scheduled appointment. If you have an Apple Watch or Kardia, please upload any pertinent ECG strips the day before or morning of your appointment to MyChart. Our staff will also make sure you have reviewed the consent and agree to move forward with your scheduled tele-health visit.     THE DAY OF YOUR APPOINTMENT  Approximately 15 minutes prior to your scheduled appointment, you will receive a telephone call from one of HeartCare team - your caller ID may say "Unknown caller."  Our staff will confirm medications, vital signs for the day and any symptoms you may be experiencing. Please have this information available prior to the time of visit start. It may also be helpful for you to have a pad of paper and pen handy for any instructions given during your visit. They will also walk you  through joining the smartphone meeting if this is a video visit.    CONSENT FOR TELE-HEALTH VISIT - PLEASE REVIEW  I hereby voluntarily request, consent and authorize [facility] and its employed or contracted physicians, physician assistants, nurse practitioners or other licensed health care professionals (the Practitioner), to provide me with telemedicine health care services (the Services") as deemed necessary by the treating Practitioner. I acknowledge and consent to receive the Services by the Practitioner via telemedicine. I understand that the telemedicine visit will involve communicating with the Practitioner through live audiovisual communication technology and the disclosure of certain medical information by electronic transmission. I acknowledge that I have been given the opportunity to request an in-person assessment or other available alternative prior to the telemedicine visit and am voluntarily participating in the telemedicine visit.  I understand that I have the right to withhold or withdraw my consent to the use of telemedicine in the course of my care at any time, without affecting my right to future care or treatment, and that the Practitioner or I may terminate the telemedicine visit at any time. I understand that I have the right to inspect all information obtained and/or recorded in the course of the telemedicine visit and may receive copies of available information for a reasonable fee.  I understand that some of the potential risks of receiving the Services via telemedicine include:   Delay or interruption in medical evaluation due to technological equipment failure or disruption;  Information transmitted may not be sufficient (e.g. poor resolution of images) to allow for appropriate medical decision making by the Practitioner; and/or   In rare instances, security protocols could fail, causing a breach of personal health information.  Furthermore, I acknowledge that it is  my responsibility to provide information about my medical history, conditions and care that is complete and accurate to the best of my ability. I acknowledge that Practitioner's advice, recommendations, and/or decision may be based on factors not within their control, such as incomplete or inaccurate data provided by me or distortions of diagnostic images or specimens that may result from electronic transmissions. I understand that the practice of medicine is  not an Visual merchandiser and that Practitioner makes no warranties or guarantees regarding treatment outcomes. I acknowledge that I will receive a copy of this consent concurrently upon execution via email to the email address I last provided but may also request a printed copy by calling the office of [facility].    I understand that my insurance will be billed for this visit.   I have read or had this consent read to me.  I understand the contents of this consent, which adequately explains the benefits and risks of the Services being provided via telemedicine.   I have been provided ample opportunity to ask questions regarding this consent and the Services and have had my questions answered to my satisfaction.  I give my informed consent for the services to be provided through the use of telemedicine in my medical care  By participating in this telemedicine visit I agree to the above.

## 2019-01-24 ENCOUNTER — Encounter: Payer: Self-pay | Admitting: Cardiology

## 2019-01-24 ENCOUNTER — Telehealth (INDEPENDENT_AMBULATORY_CARE_PROVIDER_SITE_OTHER): Payer: Medicare Other | Admitting: Cardiology

## 2019-01-24 ENCOUNTER — Other Ambulatory Visit: Payer: Self-pay

## 2019-01-24 VITALS — BP 130/79 | HR 63 | Ht 70.0 in | Wt 291.6 lb

## 2019-01-24 DIAGNOSIS — E7800 Pure hypercholesterolemia, unspecified: Secondary | ICD-10-CM

## 2019-01-24 DIAGNOSIS — I4821 Permanent atrial fibrillation: Secondary | ICD-10-CM

## 2019-01-24 DIAGNOSIS — Z7901 Long term (current) use of anticoagulants: Secondary | ICD-10-CM

## 2019-01-24 DIAGNOSIS — I10 Essential (primary) hypertension: Secondary | ICD-10-CM

## 2019-01-24 MED ORDER — METOPROLOL TARTRATE 50 MG PO TABS: 50.0000 mg | ORAL_TABLET | Freq: Two times a day (BID) | ORAL | 3 refills | Status: DC

## 2019-01-24 NOTE — Patient Instructions (Addendum)
Medication Instructions:  Your physician recommends that you continue on your current medications as directed. Please refer to the Current Medication list given to you today. We have sent in the refill for your Metoprolol to [facility]  If you need a refill on your cardiac medications before your next appointment, please call your pharmacy.   Lab work: None ordered  If you have labs (blood work) drawn today and your tests are completely normal, you will receive your results only by: Marland Kitchen MyChart Message (if you have MyChart) OR . A paper copy in the mail If you have any lab test that is abnormal or we need to change your treatment, we will call you to review the results.  Testing/Procedures: None ordered   Follow-Up: At [facility], you and your health needs are our priority.  As part of our continuing mission to provide you with exceptional heart care, we have created designated Provider Care Teams.  These Care Teams include your primary Cardiologist (physician) and Advanced Practice Providers (APPs -  Physician Assistants and Nurse Practitioners) who all work together to provide you with the care you need, when you need it. You will need a follow up appointment in 9 months.  Please call our office 2 months in advance to schedule this appointment.  You may see Donato Schultz, MD or one of the following Advanced Practice Providers on your designated Care Team:   Norma Fredrickson, NP Nada Boozer, NP . Georgie Chard, NP  Any Other Special Instructions Will Be Listed Below (If Applicable).

## 2019-02-13 ENCOUNTER — Telehealth: Payer: Self-pay

## 2019-02-13 NOTE — Telephone Encounter (Signed)

## 2019-02-13 NOTE — Telephone Encounter (Signed)
lmom for prescreen  

## 2019-02-14 ENCOUNTER — Ambulatory Visit (INDEPENDENT_AMBULATORY_CARE_PROVIDER_SITE_OTHER): Payer: Medicare Other | Admitting: *Deleted

## 2019-02-14 ENCOUNTER — Other Ambulatory Visit: Payer: Self-pay

## 2019-02-14 DIAGNOSIS — I4821 Permanent atrial fibrillation: Secondary | ICD-10-CM

## 2019-02-14 DIAGNOSIS — Z5181 Encounter for therapeutic drug level monitoring: Secondary | ICD-10-CM

## 2019-02-14 LAB — POCT INR: INR: 3.1 — AB (ref 2.0–3.0)

## 2019-02-14 NOTE — Patient Instructions (Signed)
Description   Spoke with pt and wife and instructed him to take half a tablet of Coumadin tonight, then start dose of taking 1 tablet daily.  Recheck INR in 3 weeks.

## 2019-03-01 ENCOUNTER — Telehealth: Payer: Self-pay

## 2019-03-01 NOTE — Telephone Encounter (Signed)
lmom for prescreen  

## 2019-03-01 NOTE — Telephone Encounter (Signed)

## 2019-03-07 ENCOUNTER — Other Ambulatory Visit: Payer: Self-pay

## 2019-03-07 ENCOUNTER — Other Ambulatory Visit: Payer: Self-pay | Admitting: Cardiology

## 2019-03-07 ENCOUNTER — Ambulatory Visit (INDEPENDENT_AMBULATORY_CARE_PROVIDER_SITE_OTHER): Payer: Medicare Other | Admitting: *Deleted

## 2019-03-07 DIAGNOSIS — Z5181 Encounter for therapeutic drug level monitoring: Secondary | ICD-10-CM | POA: Diagnosis not present

## 2019-03-07 DIAGNOSIS — I4821 Permanent atrial fibrillation: Secondary | ICD-10-CM

## 2019-03-07 LAB — POCT INR: INR: 2.9 (ref 2.0–3.0)

## 2019-03-07 NOTE — Patient Instructions (Signed)
Description   Continue taking 1 tablet daily.  Recheck INR in 4 weeks.      

## 2019-03-28 ENCOUNTER — Telehealth: Payer: Self-pay

## 2019-03-28 NOTE — Telephone Encounter (Signed)
LMOM FOR PRESCREEN  

## 2019-04-04 ENCOUNTER — Ambulatory Visit (INDEPENDENT_AMBULATORY_CARE_PROVIDER_SITE_OTHER): Payer: Medicare Other | Admitting: Pharmacist

## 2019-04-04 ENCOUNTER — Other Ambulatory Visit: Payer: Self-pay

## 2019-04-04 DIAGNOSIS — Z5181 Encounter for therapeutic drug level monitoring: Secondary | ICD-10-CM

## 2019-04-04 DIAGNOSIS — I4821 Permanent atrial fibrillation: Secondary | ICD-10-CM | POA: Diagnosis not present

## 2019-04-04 LAB — POCT INR: INR: 2.3 (ref 2.0–3.0)

## 2019-04-04 NOTE — Patient Instructions (Signed)
Continue taking 1 tablet daily.  Recheck INR in 5 weeks.

## 2019-05-09 ENCOUNTER — Other Ambulatory Visit: Payer: Self-pay

## 2019-05-09 ENCOUNTER — Ambulatory Visit (INDEPENDENT_AMBULATORY_CARE_PROVIDER_SITE_OTHER): Payer: Medicare Other | Admitting: Pharmacist

## 2019-05-09 DIAGNOSIS — Z5181 Encounter for therapeutic drug level monitoring: Secondary | ICD-10-CM

## 2019-05-09 DIAGNOSIS — I4821 Permanent atrial fibrillation: Secondary | ICD-10-CM

## 2019-05-09 LAB — POCT INR: INR: 1.5 — AB (ref 2.0–3.0)

## 2019-05-30 ENCOUNTER — Other Ambulatory Visit: Payer: Self-pay

## 2019-05-30 ENCOUNTER — Ambulatory Visit (INDEPENDENT_AMBULATORY_CARE_PROVIDER_SITE_OTHER): Payer: Medicare Other | Admitting: Pharmacist

## 2019-05-30 DIAGNOSIS — Z5181 Encounter for therapeutic drug level monitoring: Secondary | ICD-10-CM | POA: Diagnosis not present

## 2019-05-30 DIAGNOSIS — I4821 Permanent atrial fibrillation: Secondary | ICD-10-CM

## 2019-05-30 LAB — POCT INR: INR: 1.9 — AB (ref 2.0–3.0)

## 2019-06-12 ENCOUNTER — Other Ambulatory Visit: Payer: Self-pay | Admitting: Cardiology

## 2019-06-12 DIAGNOSIS — I4821 Permanent atrial fibrillation: Secondary | ICD-10-CM

## 2019-06-20 ENCOUNTER — Other Ambulatory Visit: Payer: Self-pay

## 2019-06-20 ENCOUNTER — Ambulatory Visit (INDEPENDENT_AMBULATORY_CARE_PROVIDER_SITE_OTHER): Payer: Medicare Other | Admitting: Pharmacist Clinician (PhC)/ Clinical Pharmacy Specialist

## 2019-06-20 DIAGNOSIS — Z5181 Encounter for therapeutic drug level monitoring: Secondary | ICD-10-CM | POA: Diagnosis not present

## 2019-06-20 DIAGNOSIS — I4821 Permanent atrial fibrillation: Secondary | ICD-10-CM

## 2019-06-20 LAB — POCT INR: INR: 1.8 — AB (ref 2.0–3.0)

## 2019-07-09 ENCOUNTER — Other Ambulatory Visit: Payer: Self-pay

## 2019-07-09 ENCOUNTER — Ambulatory Visit (INDEPENDENT_AMBULATORY_CARE_PROVIDER_SITE_OTHER): Payer: Medicare Other | Admitting: Pharmacist

## 2019-07-09 DIAGNOSIS — Z5181 Encounter for therapeutic drug level monitoring: Secondary | ICD-10-CM

## 2019-07-09 DIAGNOSIS — I4821 Permanent atrial fibrillation: Secondary | ICD-10-CM | POA: Diagnosis not present

## 2019-07-09 LAB — POCT INR: INR: 2.8 (ref 2.0–3.0)

## 2019-08-01 ENCOUNTER — Ambulatory Visit (INDEPENDENT_AMBULATORY_CARE_PROVIDER_SITE_OTHER): Payer: Medicare Other | Admitting: Pharmacist Clinician (PhC)/ Clinical Pharmacy Specialist

## 2019-08-01 ENCOUNTER — Other Ambulatory Visit: Payer: Self-pay

## 2019-08-01 DIAGNOSIS — Z5181 Encounter for therapeutic drug level monitoring: Secondary | ICD-10-CM | POA: Diagnosis not present

## 2019-08-01 DIAGNOSIS — I4821 Permanent atrial fibrillation: Secondary | ICD-10-CM | POA: Diagnosis not present

## 2019-08-01 LAB — POCT INR: INR: 2.3 (ref 2.0–3.0)

## 2019-09-07 ENCOUNTER — Other Ambulatory Visit: Payer: Self-pay | Admitting: Cardiology

## 2019-09-07 DIAGNOSIS — I4821 Permanent atrial fibrillation: Secondary | ICD-10-CM

## 2019-09-12 ENCOUNTER — Other Ambulatory Visit: Payer: Self-pay

## 2019-09-12 ENCOUNTER — Ambulatory Visit (INDEPENDENT_AMBULATORY_CARE_PROVIDER_SITE_OTHER): Payer: Medicare Other | Admitting: Pharmacist Clinician (PhC)/ Clinical Pharmacy Specialist

## 2019-09-12 DIAGNOSIS — Z5181 Encounter for therapeutic drug level monitoring: Secondary | ICD-10-CM

## 2019-09-12 DIAGNOSIS — I4821 Permanent atrial fibrillation: Secondary | ICD-10-CM | POA: Diagnosis not present

## 2019-09-12 LAB — POCT INR: INR: 3.8 — AB (ref 2.0–3.0)

## 2019-09-27 ENCOUNTER — Other Ambulatory Visit: Payer: Self-pay | Admitting: Cardiology

## 2019-09-27 DIAGNOSIS — I4821 Permanent atrial fibrillation: Secondary | ICD-10-CM

## 2019-10-10 ENCOUNTER — Ambulatory Visit (INDEPENDENT_AMBULATORY_CARE_PROVIDER_SITE_OTHER): Payer: Medicare Other | Admitting: Pharmacist Clinician (PhC)/ Clinical Pharmacy Specialist

## 2019-10-10 ENCOUNTER — Encounter (INDEPENDENT_AMBULATORY_CARE_PROVIDER_SITE_OTHER): Payer: Self-pay

## 2019-10-10 ENCOUNTER — Other Ambulatory Visit: Payer: Self-pay

## 2019-10-10 ENCOUNTER — Encounter: Payer: Self-pay | Admitting: Pharmacist Clinician (PhC)/ Clinical Pharmacy Specialist

## 2019-10-10 DIAGNOSIS — I4821 Permanent atrial fibrillation: Secondary | ICD-10-CM | POA: Diagnosis not present

## 2019-10-10 DIAGNOSIS — Z5181 Encounter for therapeutic drug level monitoring: Secondary | ICD-10-CM | POA: Diagnosis not present

## 2019-10-10 LAB — POCT INR: INR: 3.5 — AB (ref 2.0–3.0)

## 2019-10-31 ENCOUNTER — Ambulatory Visit (INDEPENDENT_AMBULATORY_CARE_PROVIDER_SITE_OTHER): Payer: Medicare Other | Admitting: Pharmacist

## 2019-10-31 ENCOUNTER — Other Ambulatory Visit: Payer: Self-pay

## 2019-10-31 DIAGNOSIS — Z5181 Encounter for therapeutic drug level monitoring: Secondary | ICD-10-CM

## 2019-10-31 DIAGNOSIS — I4821 Permanent atrial fibrillation: Secondary | ICD-10-CM | POA: Diagnosis not present

## 2019-10-31 LAB — POCT INR: INR: 3.7 — AB (ref 2.0–3.0)

## 2019-11-15 ENCOUNTER — Ambulatory Visit: Payer: Medicare Other | Attending: Internal Medicine

## 2019-11-15 DIAGNOSIS — Z23 Encounter for immunization: Secondary | ICD-10-CM

## 2019-11-15 NOTE — Progress Notes (Signed)
   Covid-19 Vaccination Clinic  Name:  JADA KUHNERT    MRN: [id number] DOB: 10-Apr-1946  11/15/2019  Mr. Jaggi was observed post Covid-19 immunization for 15 minutes without incidence. He was provided with Vaccine Information Sheet and instruction to access the V-Safe system.   Mr. Willmann was instructed to call 911 with any severe reactions post vaccine: Marland Kitchen Difficulty breathing  . Swelling of your face and throat  . A fast heartbeat  . A bad rash all over your body  . Dizziness and weakness    Immunizations Administered    Name Date Dose VIS Date Route   Pfizer COVID-19 Vaccine 11/15/2019 12:08 PM 0.3 mL 08/31/2019 Intramuscular   Manufacturer: [employer], [employer]   Lot: [id number]   NDC: [address]-7

## 2019-11-20 ENCOUNTER — Other Ambulatory Visit: Payer: Self-pay

## 2019-11-20 ENCOUNTER — Encounter: Payer: Self-pay | Admitting: Cardiology

## 2019-11-20 ENCOUNTER — Ambulatory Visit (INDEPENDENT_AMBULATORY_CARE_PROVIDER_SITE_OTHER): Payer: Medicare Other | Admitting: Cardiology

## 2019-11-20 VITALS — BP 130/80 | HR 77 | Ht 70.0 in | Wt 317.0 lb

## 2019-11-20 DIAGNOSIS — I4821 Permanent atrial fibrillation: Secondary | ICD-10-CM | POA: Diagnosis not present

## 2019-11-20 DIAGNOSIS — I10 Essential (primary) hypertension: Secondary | ICD-10-CM

## 2019-11-20 NOTE — Patient Instructions (Signed)
Medication Instructions:  The current medical regimen is effective;  continue present plan and medications.  *If you need a refill on your cardiac medications before your next appointment, please call your pharmacy*  Follow-Up: At [facility], you and your health needs are our priority.  As part of our continuing mission to provide you with exceptional heart care, we have created designated Provider Care Teams.  These Care Teams include your primary Cardiologist (physician) and Advanced Practice Providers (APPs -  Physician Assistants and Nurse Practitioners) who all work together to provide you with the care you need, when you need it.  We recommend signing up for the patient portal called "MyChart".  Sign up information is provided on this After Visit Summary.  MyChart is used to connect with patients for Virtual Visits (Telemedicine).  Patients are able to view lab/test results, encounter notes, upcoming appointments, etc.  Non-urgent messages can be sent to your provider as well.   To learn more about what you can do with MyChart, go to [email or web address].    Your next appointment:   6 month(s)  The format for your next appointment:   In Person  Provider:   Laura Ingold, NP and 1 yr with Dr Skains.  Thank you for choosing [facility]!!      

## 2019-11-20 NOTE — Progress Notes (Signed)
Cardiology Office Note:    Date:  11/20/2019   ID:  [name] [name], [name] [name] [birth year], MRN [id number]  PCP:  [name] Rainier, MD  Cardiologist:  Donato Schultz, MD  Electrophysiologist:  None   Referring MD: [name] Rainier, MD     History of Present Illness:    [name] [name] is a 74 y.o. male with atrial fibrillation hypertension hyperlipidemia here for follow-up.  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  Overall feels well.  Mild shortness of breath with physical activity.  No chest pain no syncope no bleeding see below  Past Medical History:  Diagnosis Date  . A-fib (HCC)   . DDD (degenerative disc disease)   . High cholesterol   . Hypertension   . Obesity     Past Surgical History:  Procedure Laterality Date  . APPENDECTOMY    . CHOLECYSTECTOMY  02/19/2012   Procedure: LAPAROSCOPIC CHOLECYSTECTOMY WITH INTRAOPERATIVE CHOLANGIOGRAM;  Surgeon: Kandis Cocking, MD;  Location: [facility];  Service: General;  Laterality: N/A;  . HERNIA REPAIR      Current Medications: Current Meds  Medication Sig  . lisinopril (PRINIVIL,ZESTRIL) 10 MG tablet Take 10 mg by mouth daily.  . metoprolol tartrate (LOPRESSOR) 50 MG tablet Take 1 tablet (50 mg total) by mouth 2 (two) times daily.  Marland Kitchen warfarin (COUMADIN) 5 MG tablet TAKE AS DIRECTED BY COUMADIN CLINIC  . [DISCONTINUED] vitamin B-12 (CYANOCOBALAMIN) 1000 MCG tablet Take 1,000 mcg by mouth 2 (two) times daily.     Allergies:   Crestor [rosuvastatin], Statins, Welchol [colesevelam hcl], and Zetia [ezetimibe]   Social History   Socioeconomic History  . Marital status: Married    Spouse name: Not on file  . Number of children: Not on file  . Years of education: Not on file  . Highest education level: Not on file  Occupational History  . Not on file  Tobacco Use  . Smoking status: Former Smoker    Types: Cigarettes    Quit date: 09/20/1974    Years since quitting: 45.1  . Smokeless tobacco:  Never Used  Substance and Sexual Activity  . Alcohol use: Yes    Alcohol/week: 2.0 standard drinks    Types: 2 Glasses of wine per week  . Drug use: No  . Sexual activity: Never  Other Topics Concern  . Not on file  Social History Narrative  . Not on file   Social Determinants of Health   Financial Resource Strain:   . Difficulty of Paying Living Expenses: Not on file  Food Insecurity:   . Worried About Programme researcher, broadcasting/film/video in the Last Year: Not on file  . Ran Out of Food in the Last Year: Not on file  Transportation Needs:   . Lack of Transportation (Medical): Not on file  . Lack of Transportation (Non-Medical): Not on file  Physical Activity:   . Days of Exercise per Week: Not on file  . Minutes of Exercise per Session: Not on file  Stress:   . Feeling of Stress : Not on file  Social Connections:   . Frequency of Communication with Friends and Family: Not on file  . Frequency of Social Gatherings with Friends and Family: Not on file  . Attends Religious Services: Not on file  . Active Member of Clubs or Organizations: Not on file  . Attends Banker Meetings: Not on file  . Marital Status: Not on file  Family History: The patient's family history includes Cancer in his sister.  ROS:   Please see the history of present illness.     All other systems reviewed and are negative.  EKGs/Labs/Other Studies Reviewed:    The following studies were reviewed today: Prior echocardiogram 2013 showed normal EF with mild MR mildly dilated right side  EKG:  EKG is  ordered today.  The ekg ordered today demonstrates atrial fibrillation 78 with T wave inversions noted precordial leads, fairly similar to prior EKG with T wave inversions as well.  Recent Labs: No results found for requested labs within last 8760 hours.  Recent Lipid Panel No results found for: CHOL, TRIG, HDL, CHOLHDL, VLDL, LDLCALC, LDLDIRECT  Physical Exam:    VS:  BP 130/80   Pulse 77   Ht 5'  10" (1.778 m)   Wt (!) 317 lb (143.8 kg)   SpO2 97%   BMI 45.48 kg/m     Wt Readings from Last 3 Encounters:  11/20/19 (!) 317 lb (143.8 kg)  01/24/19 291 lb 9.6 oz (132.3 kg)  01/27/18 291 lb 9.6 oz (132.3 kg)     GEN: Overweight well nourished, well developed in no acute distress HEENT: Normal NECK: No JVD; No carotid bruits LYMPHATICS: No lymphadenopathy CARDIAC: Irregularly irregular, no murmurs, rubs, gallops RESPIRATORY:  Clear to auscultation without rales, wheezing or rhonchi  ABDOMEN: Soft, non-tender, non-distended MUSCULOSKELETAL: Minor ankle edema; No deformity  SKIN: Warm and dry NEUROLOGIC:  Alert and oriented x 3 PSYCHIATRIC:  Normal affect   ASSESSMENT:    1. Essential hypertension   2. Permanent atrial fibrillation (HCC)   3. Morbid obesity (HCC)    PLAN:    In order of problems listed above:  Permanent atrial fibrillation -Well rate controlled on metoprolol 50 twice daily.  Feels well. -EKG with T wave inversions noted previously.  Last echo was 2013.  Considered repeating echocardiogram.  Wish to continue with current management.  Chronic anticoagulation -Coumadin continue to monitor no change.  Morbid obesity -Continue to work on weight loss.  Weight watchers.  LDL 128 hemoglobin 14.9 creatinine 1.02 potassium 5.1 ALT 32 from outside labs.  6 months with Vernona Rieger 1 year with me  Medication Adjustments/Labs and Tests Ordered: Current medicines are reviewed at length with the patient today.  Concerns regarding medicines are outlined above.  Orders Placed This Encounter  Procedures  . EKG 12-Lead   No orders of the defined types were placed in this encounter.   Patient Instructions  Medication Instructions:  The current medical regimen is effective;  continue present plan and medications.  *If you need a refill on your cardiac medications before your next appointment, please call your pharmacy*  Follow-Up: At [facility], you and your  health needs are our priority.  As part of our continuing mission to provide you with exceptional heart care, we have created designated Provider Care Teams.  These Care Teams include your primary Cardiologist (physician) and Advanced Practice Providers (APPs -  Physician Assistants and Nurse Practitioners) who all work together to provide you with the care you need, when you need it.  We recommend signing up for the patient portal called "MyChart".  Sign up information is provided on this After Visit Summary.  MyChart is used to connect with patients for Virtual Visits (Telemedicine).  Patients are able to view lab/test results, encounter notes, upcoming appointments, etc.  Non-urgent messages can be sent to your provider as well.   To learn more  about what you can do with MyChart, go to [email or web address].    Your next appointment:   6 month(s)  The format for your next appointment:   In Person  Provider:   Cecilie Kicks, NP and 1 yr with Dr Marlou Porch.   Thank you for choosing [facility]!!         Signed, Candee Furbish, MD  11/20/2019 4:33 PM    [place]

## 2019-11-21 ENCOUNTER — Ambulatory Visit (INDEPENDENT_AMBULATORY_CARE_PROVIDER_SITE_OTHER): Payer: Medicare Other | Admitting: Pharmacist Clinician (PhC)/ Clinical Pharmacy Specialist

## 2019-11-21 DIAGNOSIS — I4821 Permanent atrial fibrillation: Secondary | ICD-10-CM

## 2019-11-21 DIAGNOSIS — Z5181 Encounter for therapeutic drug level monitoring: Secondary | ICD-10-CM | POA: Diagnosis not present

## 2019-11-21 LAB — POCT INR: INR: 3.8 — AB (ref 2.0–3.0)

## 2019-11-21 NOTE — Patient Instructions (Signed)
HOLD warfarin dose today only, decrease warfarin dose to 1 tablet daily.  Recheck INR in 3 weeks.

## 2019-12-06 ENCOUNTER — Telehealth: Payer: Self-pay | Admitting: Pharmacist

## 2019-12-06 MED ORDER — SODIUM CHLORIDE FLUSH 0.9 % IV SOLN
10.00 | INTRAVENOUS | Status: DC
Start: 2019-12-06 — End: 2019-12-06

## 2019-12-06 MED ORDER — ATORVASTATIN CALCIUM 80 MG PO TABS
80.00 | ORAL_TABLET | ORAL | Status: DC
Start: 2019-12-07 — End: 2019-12-06

## 2019-12-06 MED ORDER — ACETAMINOPHEN 325 MG PO TABS
650.00 | ORAL_TABLET | ORAL | Status: DC
Start: ? — End: 2019-12-06

## 2019-12-06 MED ORDER — SENNOSIDES-DOCUSATE SODIUM 8.6-50 MG PO TABS
1.00 | ORAL_TABLET | ORAL | Status: DC
Start: ? — End: 2019-12-06

## 2019-12-06 MED ORDER — NITROGLYCERIN 0.4 MG SL SUBL
0.40 | SUBLINGUAL_TABLET | SUBLINGUAL | Status: DC
Start: ? — End: 2019-12-06

## 2019-12-06 MED ORDER — GLUCOSE-VITAMIN C 4-6 GM-MG PO CHEW
16.00 | CHEWABLE_TABLET | ORAL | Status: DC
Start: ? — End: 2019-12-06

## 2019-12-06 MED ORDER — BISACODYL 10 MG RE SUPP
10.00 | RECTAL | Status: DC
Start: ? — End: 2019-12-06

## 2019-12-06 MED ORDER — MAGNESIUM HYDROXIDE 2400 MG/10ML PO SUSP
10.00 | ORAL | Status: DC
Start: ? — End: 2019-12-06

## 2019-12-06 MED ORDER — GENERIC EXTERNAL MEDICATION
Status: DC
Start: ? — End: 2019-12-06

## 2019-12-06 MED ORDER — LACTULOSE 10 GM/15ML PO SOLN
20.00 | ORAL | Status: DC
Start: ? — End: 2019-12-06

## 2019-12-06 MED ORDER — GLUCAGON (RDNA) 1 MG IJ KIT
1.00 | PACK | INTRAMUSCULAR | Status: DC
Start: ? — End: 2019-12-06

## 2019-12-06 MED ORDER — POLYETHYLENE GLYCOL 3350 17 GM/SCOOP PO POWD
17.00 | ORAL | Status: DC
Start: ? — End: 2019-12-06

## 2019-12-06 MED ORDER — ASPIRIN 81 MG PO TBEC
81.00 | DELAYED_RELEASE_TABLET | ORAL | Status: DC
Start: 2019-12-07 — End: 2019-12-06

## 2019-12-06 MED ORDER — DEXTROSE 50 % IV SOLN
12.50 | INTRAVENOUS | Status: DC
Start: ? — End: 2019-12-06

## 2019-12-06 MED ORDER — PANTOPRAZOLE SODIUM 40 MG PO TBEC
40.00 | DELAYED_RELEASE_TABLET | ORAL | Status: DC
Start: 2019-12-07 — End: 2019-12-06

## 2019-12-06 NOTE — Telephone Encounter (Signed)
Tamir from [facility] ED called to confirm patients warfarin dose. Patient should be on 5mg  daily.

## 2019-12-11 ENCOUNTER — Ambulatory Visit: Payer: Medicare Other

## 2019-12-14 ENCOUNTER — Ambulatory Visit (INDEPENDENT_AMBULATORY_CARE_PROVIDER_SITE_OTHER): Payer: Medicare Other | Admitting: Pharmacist Clinician (PhC)/ Clinical Pharmacy Specialist

## 2019-12-14 ENCOUNTER — Other Ambulatory Visit: Payer: Self-pay

## 2019-12-14 DIAGNOSIS — Z5181 Encounter for therapeutic drug level monitoring: Secondary | ICD-10-CM | POA: Diagnosis not present

## 2019-12-14 DIAGNOSIS — I4821 Permanent atrial fibrillation: Secondary | ICD-10-CM | POA: Diagnosis not present

## 2019-12-14 LAB — POCT INR: INR: 2.2 (ref 2.0–3.0)

## 2019-12-17 ENCOUNTER — Encounter: Payer: Self-pay | Admitting: Pharmacist Clinician (PhC)/ Clinical Pharmacy Specialist

## 2019-12-18 ENCOUNTER — Ambulatory Visit: Payer: Medicare Other | Attending: Internal Medicine

## 2019-12-18 DIAGNOSIS — Z23 Encounter for immunization: Secondary | ICD-10-CM

## 2019-12-18 NOTE — Progress Notes (Signed)
   Covid-19 Vaccination Clinic  Name:  [name] [name]    MRN: [id number] DOB: October 08, 1945  12/18/2019  [name] was observed post Covid-19 immunization for 15 minutes without incident. He was provided with Vaccine Information Sheet and instruction to access the V-Safe system.   [name] was instructed to call 911 with any severe reactions post vaccine: Marland Kitchen Difficulty breathing  . Swelling of face and throat  . A fast heartbeat  . A bad rash all over body  . Dizziness and weakness   Immunizations Administered    Name Date Dose VIS Date Route   Pfizer COVID-19 Vaccine 12/18/2019  1:35 PM 0.3 mL 08/31/2019 Intramuscular   Manufacturer: [employer], [employer]   Lot: [id number]   NDC: [address]-9

## 2019-12-19 ENCOUNTER — Other Ambulatory Visit: Payer: Self-pay | Admitting: Cardiology

## 2019-12-19 DIAGNOSIS — I4821 Permanent atrial fibrillation: Secondary | ICD-10-CM

## 2019-12-24 ENCOUNTER — Other Ambulatory Visit: Payer: Self-pay | Admitting: Cardiology

## 2019-12-24 DIAGNOSIS — I4821 Permanent atrial fibrillation: Secondary | ICD-10-CM

## 2019-12-25 ENCOUNTER — Ambulatory Visit: Payer: Medicare Other

## 2019-12-25 NOTE — Telephone Encounter (Signed)
Not sure why this is coming to me, he just saw Dr. Anne Fu and meds not stopped.  So fine to refill

## 2020-01-09 ENCOUNTER — Other Ambulatory Visit: Payer: Self-pay

## 2020-01-09 ENCOUNTER — Ambulatory Visit (INDEPENDENT_AMBULATORY_CARE_PROVIDER_SITE_OTHER): Payer: Medicare Other | Admitting: Pharmacist

## 2020-01-09 DIAGNOSIS — I4821 Permanent atrial fibrillation: Secondary | ICD-10-CM | POA: Diagnosis not present

## 2020-01-09 DIAGNOSIS — Z5181 Encounter for therapeutic drug level monitoring: Secondary | ICD-10-CM

## 2020-01-09 LAB — POCT INR: INR: 2.1 (ref 2.0–3.0)

## 2020-01-30 ENCOUNTER — Other Ambulatory Visit: Payer: Self-pay

## 2020-01-30 MED ORDER — METOPROLOL TARTRATE 50 MG PO TABS: 50.0000 mg | ORAL_TABLET | Freq: Two times a day (BID) | ORAL | 3 refills | Status: DC

## 2020-01-30 NOTE — Telephone Encounter (Signed)
Pt's medication was sent to pt's pharmacy as requested. Confirmation received.  °

## 2020-02-13 ENCOUNTER — Other Ambulatory Visit: Payer: Self-pay

## 2020-02-13 ENCOUNTER — Ambulatory Visit (INDEPENDENT_AMBULATORY_CARE_PROVIDER_SITE_OTHER): Payer: Medicare Other | Admitting: Pharmacist

## 2020-02-13 DIAGNOSIS — I4821 Permanent atrial fibrillation: Secondary | ICD-10-CM | POA: Diagnosis not present

## 2020-02-13 DIAGNOSIS — Z5181 Encounter for therapeutic drug level monitoring: Secondary | ICD-10-CM | POA: Diagnosis not present

## 2020-02-13 LAB — POCT INR: INR: 2.8 (ref 2.0–3.0)

## 2020-02-13 NOTE — Patient Instructions (Signed)
Description   Continue taking 5 mg daily.  Recheck INR in 5 weeks.

## 2020-03-21 ENCOUNTER — Other Ambulatory Visit: Payer: Self-pay

## 2020-03-21 ENCOUNTER — Ambulatory Visit (INDEPENDENT_AMBULATORY_CARE_PROVIDER_SITE_OTHER): Payer: Medicare Other | Admitting: Pharmacist Clinician (PhC)/ Clinical Pharmacy Specialist

## 2020-03-21 DIAGNOSIS — Z5181 Encounter for therapeutic drug level monitoring: Secondary | ICD-10-CM | POA: Diagnosis not present

## 2020-03-21 DIAGNOSIS — I4821 Permanent atrial fibrillation: Secondary | ICD-10-CM | POA: Diagnosis not present

## 2020-03-21 LAB — POCT INR: INR: 2 (ref 2.0–3.0)

## 2020-04-30 ENCOUNTER — Other Ambulatory Visit: Payer: Self-pay

## 2020-04-30 ENCOUNTER — Ambulatory Visit (INDEPENDENT_AMBULATORY_CARE_PROVIDER_SITE_OTHER): Payer: Medicare Other

## 2020-04-30 DIAGNOSIS — I4821 Permanent atrial fibrillation: Secondary | ICD-10-CM

## 2020-04-30 DIAGNOSIS — Z5181 Encounter for therapeutic drug level monitoring: Secondary | ICD-10-CM | POA: Diagnosis not present

## 2020-04-30 LAB — POCT INR: INR: 2.8 (ref 2.0–3.0)

## 2020-04-30 NOTE — Patient Instructions (Signed)
Continue taking 5 mg daily.  Recheck INR in 6 weeks. 

## 2020-06-11 ENCOUNTER — Other Ambulatory Visit: Payer: Self-pay

## 2020-06-11 ENCOUNTER — Ambulatory Visit (INDEPENDENT_AMBULATORY_CARE_PROVIDER_SITE_OTHER): Payer: Medicare Other

## 2020-06-11 DIAGNOSIS — I4821 Permanent atrial fibrillation: Secondary | ICD-10-CM

## 2020-06-11 DIAGNOSIS — Z5181 Encounter for therapeutic drug level monitoring: Secondary | ICD-10-CM | POA: Diagnosis not present

## 2020-06-11 LAB — POCT INR: INR: 2.3 (ref 2.0–3.0)

## 2020-06-11 NOTE — Patient Instructions (Signed)
Continue taking 5 mg daily.  Recheck INR in 6 weeks. 

## 2020-07-25 ENCOUNTER — Ambulatory Visit (INDEPENDENT_AMBULATORY_CARE_PROVIDER_SITE_OTHER): Payer: Medicare Other

## 2020-07-25 ENCOUNTER — Other Ambulatory Visit: Payer: Self-pay

## 2020-07-25 DIAGNOSIS — Z5181 Encounter for therapeutic drug level monitoring: Secondary | ICD-10-CM

## 2020-07-25 DIAGNOSIS — I4821 Permanent atrial fibrillation: Secondary | ICD-10-CM

## 2020-07-25 LAB — POCT INR: INR: 1.7 — AB (ref 2.0–3.0)

## 2020-07-25 MED ORDER — WARFARIN SODIUM 5 MG PO TABS: ORAL_TABLET | ORAL | 0 refills | Status: DC

## 2020-07-25 NOTE — Patient Instructions (Signed)
Take 2 tablets tonight and then Continue taking 5 mg daily.  Recheck INR in 4 weeks.

## 2020-08-22 ENCOUNTER — Ambulatory Visit (INDEPENDENT_AMBULATORY_CARE_PROVIDER_SITE_OTHER): Payer: Medicare Other

## 2020-08-22 ENCOUNTER — Ambulatory Visit: Payer: Medicare Other | Attending: Internal Medicine

## 2020-08-22 ENCOUNTER — Other Ambulatory Visit: Payer: Self-pay

## 2020-08-22 DIAGNOSIS — Z23 Encounter for immunization: Secondary | ICD-10-CM

## 2020-08-22 DIAGNOSIS — Z5181 Encounter for therapeutic drug level monitoring: Secondary | ICD-10-CM

## 2020-08-22 DIAGNOSIS — I4821 Permanent atrial fibrillation: Secondary | ICD-10-CM | POA: Diagnosis not present

## 2020-08-22 LAB — POCT INR: INR: 2.6 (ref 2.0–3.0)

## 2020-08-22 NOTE — Patient Instructions (Signed)
Continue taking 5 mg daily.  Recheck INR in 6 weeks. 

## 2020-08-22 NOTE — Progress Notes (Signed)
   Covid-19 Vaccination Clinic  Name:  [name] [name]    MRN: [id number] DOB: 1946-08-26  08/22/2020  [name] was observed post Covid-19 immunization for 15 minutes without incident. He was provided with Vaccine Information Sheet and instruction to access the V-Safe system.   [name] was instructed to call 911 with any severe reactions post vaccine: Marland Kitchen Difficulty breathing  . Swelling of face and throat  . A fast heartbeat  . A bad rash all over body  . Dizziness and weakness   Immunizations Administered    Name Date Dose VIS Date Route   Pfizer COVID-19 Vaccine 08/22/2020  2:15 PM 0.3 mL 07/09/2020 Intramuscular   Manufacturer: [employer], [employer]   Lot: [id number]   NDC: [address]-5

## 2020-10-03 ENCOUNTER — Other Ambulatory Visit: Payer: Self-pay

## 2020-10-03 ENCOUNTER — Ambulatory Visit (INDEPENDENT_AMBULATORY_CARE_PROVIDER_SITE_OTHER): Payer: Medicare Other

## 2020-10-03 DIAGNOSIS — I4821 Permanent atrial fibrillation: Secondary | ICD-10-CM | POA: Diagnosis not present

## 2020-10-03 DIAGNOSIS — Z5181 Encounter for therapeutic drug level monitoring: Secondary | ICD-10-CM | POA: Diagnosis not present

## 2020-10-03 LAB — POCT INR: INR: 2.1 (ref 2.0–3.0)

## 2020-10-03 NOTE — Patient Instructions (Signed)
Continue taking 5 mg daily.  Recheck INR in 4 weeks.

## 2020-10-23 ENCOUNTER — Other Ambulatory Visit: Payer: Self-pay | Admitting: Cardiology

## 2020-10-23 DIAGNOSIS — I4821 Permanent atrial fibrillation: Secondary | ICD-10-CM

## 2020-10-31 ENCOUNTER — Ambulatory Visit (INDEPENDENT_AMBULATORY_CARE_PROVIDER_SITE_OTHER): Payer: Medicare Other

## 2020-10-31 ENCOUNTER — Other Ambulatory Visit: Payer: Self-pay

## 2020-10-31 DIAGNOSIS — Z5181 Encounter for therapeutic drug level monitoring: Secondary | ICD-10-CM

## 2020-10-31 DIAGNOSIS — I4821 Permanent atrial fibrillation: Secondary | ICD-10-CM | POA: Diagnosis not present

## 2020-10-31 LAB — POCT INR: INR: 1.7 — AB (ref 2.0–3.0)

## 2020-10-31 NOTE — Patient Instructions (Signed)
Take 2 tablets tonight only and then Continue taking 5 mg daily.  Recheck INR in 4 weeks.

## 2020-11-28 ENCOUNTER — Ambulatory Visit (INDEPENDENT_AMBULATORY_CARE_PROVIDER_SITE_OTHER): Payer: Medicare Other

## 2020-11-28 ENCOUNTER — Other Ambulatory Visit: Payer: Self-pay

## 2020-11-28 DIAGNOSIS — I4821 Permanent atrial fibrillation: Secondary | ICD-10-CM | POA: Diagnosis not present

## 2020-11-28 DIAGNOSIS — Z5181 Encounter for therapeutic drug level monitoring: Secondary | ICD-10-CM | POA: Diagnosis not present

## 2020-11-28 LAB — POCT INR: INR: 3.4 — AB (ref 2.0–3.0)

## 2020-11-28 NOTE — Patient Instructions (Signed)
Hold tonight only and then Continue taking 5 mg daily.  Recheck INR in 4 weeks.

## 2020-12-26 ENCOUNTER — Ambulatory Visit (INDEPENDENT_AMBULATORY_CARE_PROVIDER_SITE_OTHER): Payer: Medicare Other

## 2020-12-26 ENCOUNTER — Other Ambulatory Visit: Payer: Self-pay

## 2020-12-26 DIAGNOSIS — I4821 Permanent atrial fibrillation: Secondary | ICD-10-CM

## 2020-12-26 DIAGNOSIS — Z5181 Encounter for therapeutic drug level monitoring: Secondary | ICD-10-CM | POA: Diagnosis not present

## 2020-12-26 LAB — POCT INR: INR: 2.4 (ref 2.0–3.0)

## 2020-12-26 NOTE — Patient Instructions (Signed)
Continue taking 5 mg daily.  Recheck INR in 6 weeks. 

## 2021-01-17 ENCOUNTER — Other Ambulatory Visit: Payer: Self-pay | Admitting: Cardiology

## 2021-01-18 ENCOUNTER — Other Ambulatory Visit: Payer: Self-pay | Admitting: Cardiology

## 2021-01-18 DIAGNOSIS — I4821 Permanent atrial fibrillation: Secondary | ICD-10-CM

## 2021-01-26 ENCOUNTER — Ambulatory Visit (INDEPENDENT_AMBULATORY_CARE_PROVIDER_SITE_OTHER): Payer: Medicare Other

## 2021-01-26 ENCOUNTER — Other Ambulatory Visit: Payer: Self-pay

## 2021-01-26 DIAGNOSIS — Z5181 Encounter for therapeutic drug level monitoring: Secondary | ICD-10-CM

## 2021-01-26 DIAGNOSIS — I4821 Permanent atrial fibrillation: Secondary | ICD-10-CM | POA: Diagnosis not present

## 2021-01-26 LAB — POCT INR: INR: 2.2 (ref 2.0–3.0)

## 2021-01-26 NOTE — Patient Instructions (Signed)
Continue taking 5 mg daily.  Recheck INR in 6 weeks. 

## 2021-03-09 ENCOUNTER — Other Ambulatory Visit: Payer: Self-pay

## 2021-03-09 ENCOUNTER — Ambulatory Visit (INDEPENDENT_AMBULATORY_CARE_PROVIDER_SITE_OTHER): Payer: Medicare Other

## 2021-03-09 DIAGNOSIS — I4821 Permanent atrial fibrillation: Secondary | ICD-10-CM | POA: Diagnosis not present

## 2021-03-09 DIAGNOSIS — Z5181 Encounter for therapeutic drug level monitoring: Secondary | ICD-10-CM | POA: Diagnosis not present

## 2021-03-09 LAB — POCT INR: INR: 2.3 (ref 2.0–3.0)

## 2021-03-09 NOTE — Patient Instructions (Signed)
Continue taking 5 mg daily.  Recheck INR in 6 weeks. 

## 2021-03-11 ENCOUNTER — Other Ambulatory Visit: Payer: Self-pay | Admitting: Cardiology

## 2021-04-01 ENCOUNTER — Other Ambulatory Visit: Payer: Self-pay

## 2021-04-01 ENCOUNTER — Encounter: Payer: Self-pay | Admitting: Cardiology

## 2021-04-01 ENCOUNTER — Ambulatory Visit (INDEPENDENT_AMBULATORY_CARE_PROVIDER_SITE_OTHER): Payer: Medicare Other | Admitting: Cardiology

## 2021-04-01 VITALS — BP 140/90 | HR 94 | Ht 70.0 in | Wt 324.0 lb

## 2021-04-01 DIAGNOSIS — I4821 Permanent atrial fibrillation: Secondary | ICD-10-CM | POA: Diagnosis not present

## 2021-04-01 DIAGNOSIS — Z7901 Long term (current) use of anticoagulants: Secondary | ICD-10-CM

## 2021-04-01 NOTE — Patient Instructions (Signed)
Medication Instructions:  The current medical regimen is effective;  continue present plan and medications.  *If you need a refill on your cardiac medications before your next appointment, please call your pharmacy*  Follow-Up: At [facility], you and your health needs are our priority.  As part of our continuing mission to provide you with exceptional heart care, we have created designated Provider Care Teams.  These Care Teams include your primary Cardiologist (physician) and Advanced Practice Providers (APPs -  Physician Assistants and Nurse Practitioners) who all work together to provide you with the care you need, when you need it.  We recommend signing up for the patient portal called "MyChart".  Sign up information is provided on this After Visit Summary.  MyChart is used to connect with patients for Virtual Visits (Telemedicine).  Patients are able to view lab/test results, encounter notes, upcoming appointments, etc.  Non-urgent messages can be sent to your provider as well.   To learn more about what you can do with MyChart, go to [email or web address].    Your next appointment:   1 year(s)  The format for your next appointment:   In Person  Provider:   Mark Skains, MD   Thank you for choosing [facility]!!    

## 2021-04-01 NOTE — Progress Notes (Signed)
Cardiology Office Note:    Date:  04/01/2021   ID:  [name] [name], DOB 27-Oct-1945, MRN [id number]  PCP:  [facility] Providers Cardiologist:  Donato Schultz, MD     Referring MD: Juluis Rainier, MD    History of Present Illness:    [name] [name] is a 75 y.o. male here for follow up of AFIB, HTN, HL.  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 She has AFIB as well.  No major complaints. Mild SOB, no CP, no F/V/N.   Past Medical History:  Diagnosis Date   A-fib (HCC)    DDD (degenerative disc disease)    High cholesterol    Hypertension    Obesity     Past Surgical History:  Procedure Laterality Date   APPENDECTOMY     CHOLECYSTECTOMY  02/19/2012   Procedure: LAPAROSCOPIC CHOLECYSTECTOMY WITH INTRAOPERATIVE CHOLANGIOGRAM;  Surgeon: Kandis Cocking, MD;  Location: [facility];  Service: General;  Laterality: N/A;   HERNIA REPAIR      Current Medications: Current Meds  Medication Sig   lisinopril (PRINIVIL,ZESTRIL) 10 MG tablet Take 10 mg by mouth daily.   metoprolol tartrate (LOPRESSOR) 50 MG tablet Take 1 tablet (50 mg total) by mouth 2 (two) times daily. Patient needs to keep upoming appointment for any future refills.   vitamin B-12 (CYANOCOBALAMIN) 1000 MCG tablet Take 1,000 mcg by mouth daily.   warfarin (COUMADIN) 5 MG tablet TAKE ONE TABLET DAILY OR AS DIRECTED BY COUMADIN CLINIC     Allergies:   Crestor [rosuvastatin], Statins, Welchol [colesevelam hcl], and Zetia [ezetimibe]   Social History   Socioeconomic History   Marital status: Married    Spouse name: Not on file   Number of children: Not on file   Years of education: Not on file   Highest education level: Not on file  Occupational History   Not on file  Tobacco Use   Smoking status: Former    Pack years: 0.00    Types: Cigarettes    Quit date: 09/20/1974    Years since quitting: 46.5   Smokeless tobacco: Never  Substance and Sexual  Activity   Alcohol use: Yes    Alcohol/week: 2.0 standard drinks    Types: 2 Glasses of wine per week   Drug use: No   Sexual activity: Never  Other Topics Concern   Not on file  Social History Narrative   Not on file   Social Determinants of Health   Financial Resource Strain: Not on file  Food Insecurity: Not on file  Transportation Needs: Not on file  Physical Activity: Not on file  Stress: Not on file  Social Connections: Not on file     Family History: The patient's family history includes Cancer in his sister.  ROS:   Please see the history of present illness.     All other systems reviewed and are negative.  EKGs/Labs/Other Studies Reviewed:    The following studies were reviewed today:  ECHO 2013 --Normal EF, mild MR, mildly dilated RV (obesity)  EKG:  EKG is  ordered today.  The ekg ordered today demonstrates atrial fibrillation 89 bpm with PVC noted.  Baseline wander.  Stable.  Recent Labs: No results found for requested labs within last 8760 hours.  Recent Lipid Panel No results found for: CHOL, TRIG, HDL, CHOLHDL, VLDL, LDLCALC, LDLDIRECT   Risk Assessment/Calculations:  Physical Exam:    VS:  BP 140/90 (BP Location: Left Arm, Patient Position: Sitting, Cuff Size: Normal)   Pulse 94   Ht 5\' 10"  (1.778 m)   Wt (!) 324 lb (147 kg)   SpO2 94%   BMI 46.49 kg/m     Wt Readings from Last 3 Encounters:  04/01/21 (!) 324 lb (147 kg)  11/20/19 (!) 317 lb (143.8 kg)  01/24/19 291 lb 9.6 oz (132.3 kg)     GEN:  Well nourished, well developed in no acute distress HEENT: Normal NECK: No JVD; No carotid bruits LYMPHATICS: No lymphadenopathy CARDIAC: Irregularly irregular, no murmurs, rubs, gallops RESPIRATORY:  Clear to auscultation without rales, wheezing or rhonchi  ABDOMEN: Soft, non-tender, non-distended MUSCULOSKELETAL:  No edema; No deformity  SKIN: Warm and dry NEUROLOGIC:  Alert and oriented x 3 PSYCHIATRIC:  Normal affect    ASSESSMENT:    1. Permanent atrial fibrillation (HCC)   2. Morbid obesity (HCC)   3. Chronic anticoagulation    PLAN:    In order of problems listed above:  Permanent atrial fibrillation --Rate controlled, continue current medical management with metoprolol 50mg  BID.  Refills as needed.  Doing well.  Chronic anticoagulation --Coumadin. Continue with close monitoring of higher risk medication with bleeding side effect and tight therapeutic window. Followed here at our coumadin clinic.  Last hemoglobin 14.2 creatinine 0.9 potassium 5.1  Morbid obesity  --Continue with weight loss plans, diet, exercise.  Continue to work on decreasing carbohydrates.  It has been challenging with COVID era.    1 yr f/u   Medication Adjustments/Labs and Tests Ordered: Current medicines are reviewed at length with the patient today.  Concerns regarding medicines are outlined above.  Orders Placed This Encounter  Procedures   EKG 12-Lead    No orders of the defined types were placed in this encounter.   Patient Instructions  Medication Instructions:  The current medical regimen is effective;  continue present plan and medications.  *If you need a refill on your cardiac medications before your next appointment, please call your pharmacy*  Follow-Up: At [facility], you and your health needs are our priority.  As part of our continuing mission to provide you with exceptional heart care, we have created designated Provider Care Teams.  These Care Teams include your primary Cardiologist (physician) and Advanced Practice Providers (APPs -  Physician Assistants and Nurse Practitioners) who all work together to provide you with the care you need, when you need it.  We recommend signing up for the patient portal called "MyChart".  Sign up information is provided on this After Visit Summary.  MyChart is used to connect with patients for Virtual Visits (Telemedicine).  Patients are able to view  lab/test results, encounter notes, upcoming appointments, etc.  Non-urgent messages can be sent to your provider as well.   To learn more about what you can do with MyChart, go to .    Your next appointment:   1 year(s)  The format for your next appointment:   In Person  Provider:   [facility], MD  Thank you for choosing [facility]!!     Signed, Donato Schultz, MD  04/01/2021 4:05 PM    [facility]

## 2021-04-13 ENCOUNTER — Other Ambulatory Visit: Payer: Self-pay | Admitting: Cardiology

## 2021-04-13 DIAGNOSIS — I4821 Permanent atrial fibrillation: Secondary | ICD-10-CM

## 2021-04-20 ENCOUNTER — Ambulatory Visit (INDEPENDENT_AMBULATORY_CARE_PROVIDER_SITE_OTHER): Payer: Medicare Other

## 2021-04-20 ENCOUNTER — Other Ambulatory Visit: Payer: Self-pay

## 2021-04-20 DIAGNOSIS — Z5181 Encounter for therapeutic drug level monitoring: Secondary | ICD-10-CM

## 2021-04-20 DIAGNOSIS — I4821 Permanent atrial fibrillation: Secondary | ICD-10-CM | POA: Diagnosis not present

## 2021-04-20 LAB — POCT INR: INR: 3.7 — AB (ref 2.0–3.0)

## 2021-04-20 NOTE — Patient Instructions (Signed)
Hold today only and then Continue taking 5 mg daily.  Recheck INR in 4 weeks.

## 2021-05-04 ENCOUNTER — Other Ambulatory Visit: Payer: Self-pay | Admitting: Cardiology

## 2021-05-18 ENCOUNTER — Ambulatory Visit (INDEPENDENT_AMBULATORY_CARE_PROVIDER_SITE_OTHER): Payer: Medicare Other

## 2021-05-18 ENCOUNTER — Other Ambulatory Visit: Payer: Self-pay

## 2021-05-18 DIAGNOSIS — Z5181 Encounter for therapeutic drug level monitoring: Secondary | ICD-10-CM | POA: Diagnosis not present

## 2021-05-18 DIAGNOSIS — I4821 Permanent atrial fibrillation: Secondary | ICD-10-CM

## 2021-05-18 LAB — POCT INR: INR: 2.8 (ref 2.0–3.0)

## 2021-05-18 NOTE — Patient Instructions (Signed)
Continue taking 5 mg daily.  Recheck INR in 6 weeks. 

## 2021-06-29 ENCOUNTER — Ambulatory Visit (INDEPENDENT_AMBULATORY_CARE_PROVIDER_SITE_OTHER): Payer: Medicare Other

## 2021-06-29 ENCOUNTER — Other Ambulatory Visit: Payer: Self-pay

## 2021-06-29 DIAGNOSIS — Z5181 Encounter for therapeutic drug level monitoring: Secondary | ICD-10-CM

## 2021-06-29 DIAGNOSIS — I4821 Permanent atrial fibrillation: Secondary | ICD-10-CM | POA: Diagnosis not present

## 2021-06-29 LAB — POCT INR: INR: 2.3 (ref 2.0–3.0)

## 2021-06-29 NOTE — Patient Instructions (Signed)
Continue taking 5 mg daily.  Recheck INR in 6 weeks. 

## 2021-07-12 ENCOUNTER — Other Ambulatory Visit: Payer: Self-pay | Admitting: Cardiology

## 2021-07-12 DIAGNOSIS — I4821 Permanent atrial fibrillation: Secondary | ICD-10-CM

## 2021-07-13 NOTE — Telephone Encounter (Signed)
Prescription refill request received for warfarin Lov: afib, 04/01/2021, Skains Next INR check: 11/21 Warfarin tablet strength: 5mg    Refill sent.

## 2021-08-10 ENCOUNTER — Other Ambulatory Visit: Payer: Self-pay

## 2021-08-10 ENCOUNTER — Ambulatory Visit (INDEPENDENT_AMBULATORY_CARE_PROVIDER_SITE_OTHER): Payer: Medicare Other

## 2021-08-10 DIAGNOSIS — Z5181 Encounter for therapeutic drug level monitoring: Secondary | ICD-10-CM | POA: Diagnosis not present

## 2021-08-10 DIAGNOSIS — I4821 Permanent atrial fibrillation: Secondary | ICD-10-CM | POA: Diagnosis not present

## 2021-08-10 LAB — POCT INR: INR: 2.7 (ref 2.0–3.0)

## 2021-08-10 NOTE — Patient Instructions (Signed)
Continue taking 5 mg daily.  Recheck INR in 6 weeks.

## 2021-09-28 ENCOUNTER — Ambulatory Visit (INDEPENDENT_AMBULATORY_CARE_PROVIDER_SITE_OTHER): Payer: Medicare Other

## 2021-09-28 ENCOUNTER — Other Ambulatory Visit: Payer: Self-pay

## 2021-09-28 DIAGNOSIS — I4821 Permanent atrial fibrillation: Secondary | ICD-10-CM

## 2021-09-28 DIAGNOSIS — Z5181 Encounter for therapeutic drug level monitoring: Secondary | ICD-10-CM | POA: Diagnosis not present

## 2021-09-28 LAB — POCT INR: INR: 2.9 (ref 2.0–3.0)

## 2021-09-28 NOTE — Patient Instructions (Signed)
Continue taking 5 mg daily.  Recheck INR in 5 weeks.

## 2021-10-15 ENCOUNTER — Other Ambulatory Visit: Payer: Self-pay | Admitting: Cardiology

## 2021-10-15 DIAGNOSIS — I4821 Permanent atrial fibrillation: Secondary | ICD-10-CM

## 2021-11-04 ENCOUNTER — Ambulatory Visit (INDEPENDENT_AMBULATORY_CARE_PROVIDER_SITE_OTHER): Payer: Medicare Other

## 2021-11-04 ENCOUNTER — Other Ambulatory Visit: Payer: Self-pay

## 2021-11-04 DIAGNOSIS — Z5181 Encounter for therapeutic drug level monitoring: Secondary | ICD-10-CM

## 2021-11-04 DIAGNOSIS — I4821 Permanent atrial fibrillation: Secondary | ICD-10-CM

## 2021-11-04 LAB — POCT INR: INR: 3.2 — AB (ref 2.0–3.0)

## 2021-11-04 NOTE — Patient Instructions (Signed)
Continue taking 5 mg daily.  Recheck INR in 4 weeks.  Eat greens today

## 2021-12-03 ENCOUNTER — Other Ambulatory Visit: Payer: Self-pay

## 2021-12-03 ENCOUNTER — Ambulatory Visit (INDEPENDENT_AMBULATORY_CARE_PROVIDER_SITE_OTHER): Payer: Medicare Other

## 2021-12-03 DIAGNOSIS — Z5181 Encounter for therapeutic drug level monitoring: Secondary | ICD-10-CM | POA: Diagnosis not present

## 2021-12-03 DIAGNOSIS — I4821 Permanent atrial fibrillation: Secondary | ICD-10-CM | POA: Diagnosis not present

## 2021-12-03 LAB — POCT INR: INR: 2.8 (ref 2.0–3.0)

## 2021-12-03 NOTE — Patient Instructions (Signed)
Continue taking 5 mg daily.  Recheck INR in 6 weeks. 

## 2022-01-14 ENCOUNTER — Ambulatory Visit (INDEPENDENT_AMBULATORY_CARE_PROVIDER_SITE_OTHER): Payer: Medicare Other

## 2022-01-14 DIAGNOSIS — I4821 Permanent atrial fibrillation: Secondary | ICD-10-CM | POA: Diagnosis not present

## 2022-01-14 DIAGNOSIS — Z5181 Encounter for therapeutic drug level monitoring: Secondary | ICD-10-CM

## 2022-01-14 LAB — POCT INR: INR: 2.8 (ref 2.0–3.0)

## 2022-01-14 NOTE — Patient Instructions (Signed)
Continue taking 5 mg daily.  Recheck INR in 6 weeks. 

## 2022-01-18 ENCOUNTER — Other Ambulatory Visit: Payer: Self-pay | Admitting: Cardiology

## 2022-01-18 DIAGNOSIS — I4821 Permanent atrial fibrillation: Secondary | ICD-10-CM

## 2022-01-18 NOTE — Telephone Encounter (Signed)
Received request for warfarin refill: ? ?Last INR was 2.8 on 01/14/22 ?Next INR due 02/25/22 ?Last OV 04/01/21  Michele Rockers MD ? ?Refill approved ?

## 2022-02-25 ENCOUNTER — Ambulatory Visit (INDEPENDENT_AMBULATORY_CARE_PROVIDER_SITE_OTHER): Payer: Medicare Other

## 2022-02-25 DIAGNOSIS — Z5181 Encounter for therapeutic drug level monitoring: Secondary | ICD-10-CM

## 2022-02-25 DIAGNOSIS — I4821 Permanent atrial fibrillation: Secondary | ICD-10-CM

## 2022-02-25 LAB — POCT INR: INR: 2.3 (ref 2.0–3.0)

## 2022-02-25 NOTE — Patient Instructions (Signed)
Continue taking 5 mg daily.  Recheck INR in 6 weeks. 

## 2022-03-29 ENCOUNTER — Encounter: Payer: Self-pay | Admitting: Cardiology

## 2022-03-29 ENCOUNTER — Ambulatory Visit (INDEPENDENT_AMBULATORY_CARE_PROVIDER_SITE_OTHER): Payer: Medicare Other | Admitting: Cardiology

## 2022-03-29 DIAGNOSIS — Z7901 Long term (current) use of anticoagulants: Secondary | ICD-10-CM | POA: Diagnosis not present

## 2022-03-29 DIAGNOSIS — I10 Essential (primary) hypertension: Secondary | ICD-10-CM | POA: Diagnosis not present

## 2022-03-29 DIAGNOSIS — E7800 Pure hypercholesterolemia, unspecified: Secondary | ICD-10-CM

## 2022-03-29 DIAGNOSIS — I4821 Permanent atrial fibrillation: Secondary | ICD-10-CM | POA: Diagnosis not present

## 2022-03-29 NOTE — Assessment & Plan Note (Signed)
On lisinopril as well as metoprolol.

## 2022-03-29 NOTE — Assessment & Plan Note (Signed)
Coumadin.  No changes made.  Continue to monitor INR lab.  No evidence of bleeding.

## 2022-03-29 NOTE — Assessment & Plan Note (Addendum)
Continue to encourage further weight loss.  Good job with current weight loss.

## 2022-03-29 NOTE — Progress Notes (Signed)
Cardiology Office Note:    Date:  03/29/2022   ID:  MYKAI WENDORF, DOB September 01, 1946, MRN [id number]  PCP:  [facility] @ [facility] Providers Cardiologist:  Donato Schultz, MD     Referring MD: [facility]    History of Present Illness:    [name] [name] is a 76 y.o. male here for the follow-up of atrial fibrillation hypertension hyperlipidemia.  Once again [name] [name], his wife is a retired Char[employer] fundraiser who is also a patient of mine here today.  She has atrial fibrillation as well.  Overall he seems to be doing quite well without any major complaints.  No fevers chills nausea vomiting syncope bleeding.  He has had troubles in the past with Crestor statins WelChol and Zetia.  He also is taking warfarin secondary to cost.  Previously we have noted increase in weight gain from 2020-291 up to 2022-324 pounds.  Now he is back down to 305.  Excellent.  Past Medical History:  Diagnosis Date   A-fib (HCC)    DDD (degenerative disc disease)    High cholesterol    Hypertension    Obesity     Past Surgical History:  Procedure Laterality Date   APPENDECTOMY     CHOLECYSTECTOMY  02/19/2012   Procedure: LAPAROSCOPIC CHOLECYSTECTOMY WITH INTRAOPERATIVE CHOLANGIOGRAM;  Surgeon: Kandis Cocking, MD;  Location: [facility];  Service: General;  Laterality: N/A;   HERNIA REPAIR      Current Medications: Current Meds  Medication Sig   lisinopril (PRINIVIL,ZESTRIL) 10 MG tablet Take 10 mg by mouth daily.   metoprolol tartrate (LOPRESSOR) 50 MG tablet Take 1 tablet (50 mg total) by mouth 2 (two) times daily.   vitamin B-12 (CYANOCOBALAMIN) 1000 MCG tablet Take 1,000 mcg by mouth daily.   warfarin (COUMADIN) 5 MG tablet TAKE ONE TABLET DAILY OR AS DIRECTED BY COUMADIN CLINIC     Allergies:   Crestor [rosuvastatin], Statins, Welchol [colesevelam hcl], and Zetia [ezetimibe]   Social History   Socioeconomic History   Marital status: Married    Spouse name: Not on  file   Number of children: Not on file   Years of education: Not on file   Highest education level: Not on file  Occupational History   Not on file  Tobacco Use   Smoking status: Former    Types: Cigarettes    Quit date: 09/20/1974    Years since quitting: 47.5   Smokeless tobacco: Never  Substance and Sexual Activity   Alcohol use: Yes    Alcohol/week: 2.0 standard drinks of alcohol    Types: 2 Glasses of wine per week   Drug use: No   Sexual activity: Never  Other Topics Concern   Not on file  Social History Narrative   Not on file   Social Determinants of Health   Financial Resource Strain: Not on file  Food Insecurity: Not on file  Transportation Needs: Not on file  Physical Activity: Not on file  Stress: Not on file  Social Connections: Not on file     Family History: The patient's family history includes Cancer in his sister.  ROS:   Please see the history of present illness.     All other systems reviewed and are negative.  EKGs/Labs/Other Studies Reviewed:    The following studies were reviewed today: Prior echocardiogram 2013 showed normal EF mild MR mildly dilated RV  EKG:  EKG is  ordered today.  The ekg  ordered today demonstrates atrial fibrillation heart rate 81 bpm nonspecific ST-T wave changes prior EKG showed atrial fibrillation 89 with PVC.  Recent Labs: No results found for requested labs within last 365 days.  Recent Lipid Panel No results found for: "CHOL", "TRIG", "HDL", "CHOLHDL", "VLDL", "LDLCALC", "LDLDIRECT"   Risk Assessment/Calculations:              Physical Exam:    VS:  BP 112/70 (BP Location: Left Arm, Patient Position: Sitting, Cuff Size: Large)   Pulse 81   Ht 5\' 10"  (1.778 m)   Wt (!) 305 lb (138.3 kg)   BMI 43.76 kg/m     Wt Readings from Last 3 Encounters:  03/29/22 (!) 305 lb (138.3 kg)  04/01/21 (!) 324 lb (147 kg)  11/20/19 (!) 317 lb (143.8 kg)     GEN:  Well nourished, well developed in no acute  distress HEENT: Normal NECK: No JVD; No carotid bruits LYMPHATICS: No lymphadenopathy CARDIAC: Irregularly irregular, no murmurs, no rubs, gallops RESPIRATORY:  Clear to auscultation without rales, wheezing or rhonchi  ABDOMEN: Soft, non-tender, non-distended MUSCULOSKELETAL:  No edema; No deformity  SKIN: Warm and dry NEUROLOGIC:  Alert and oriented x 3 PSYCHIATRIC:  Normal affect   ASSESSMENT:    1. Permanent atrial fibrillation (HCC)   2. Morbid obesity (HCC)   3. Chronic anticoagulation   4. Essential hypertension   5. Pure hypercholesterolemia    PLAN:    In order of problems listed above:  Permanent atrial fibrillation (HCC) First detected in 2013.  Overall doing well with good rate control.  His metoprolol is at 50 mg twice a day, prescription drug management.  We will continue.  No changes made.  Morbid obesity Continue to encourage further weight loss.  Good job with current weight loss.  Chronic anticoagulation Coumadin.  No changes made.  Continue to monitor INR lab.  No evidence of bleeding.  Essential hypertension On lisinopril as well as metoprolol.  Hyperlipidemia Prior LDL 171.  Has been intolerant to statin medications.  I discussed with him potential for alternatives.  If necessary, we can have him visit our lipid clinic for further discussion.  We will check a complete metabolic profile, lipid panel, CBC         Medication Adjustments/Labs and Tests Ordered: Current medicines are reviewed at length with the patient today.  Concerns regarding medicines are outlined above.  Orders Placed This Encounter  Procedures   Lipid panel   Comprehensive metabolic panel   CBC   EKG 12-Lead   No orders of the defined types were placed in this encounter.   Patient Instructions  Medication Instructions:  Your physician recommends that you continue on your current medications as directed. Please refer to the Current Medication list given to you  today.  *If you need a refill on your cardiac medications before your next appointment, please call your pharmacy*   Lab Work: CBC, Cmet, and lipids If you have labs (blood work) drawn today and your tests are completely normal, you will receive your results only by: MyChart Message (if you have MyChart) OR A paper copy in the mail If you have any lab test that is abnormal or we need to change your treatment, we will call you to review the results.   Follow-Up: At [facility], you and your health needs are our priority.  As part of our continuing mission to provide you with exceptional heart care, we have created designated Provider Care Teams.  These Care Teams include your primary Cardiologist (physician) and Advanced Practice Providers (APPs -  Physician Assistants and Nurse Practitioners) who all work together to provide you with the care you need, when you need it.  We recommend signing up for the patient portal called "MyChart".  Sign up information is provided on this After Visit Summary.  MyChart is used to connect with patients for Virtual Visits (Telemedicine).  Patients are able to view lab/test results, encounter notes, upcoming appointments, etc.  Non-urgent messages can be sent to your provider as well.   To learn more about what you can do with MyChart, go to [email or web address].    Your next appointment:   1 year(s)  The format for your next appointment:   In Person  Provider:   Donato Schultz, MD             Signed, Donato Schultz, MD  03/29/2022 2:29 PM    [facility]

## 2022-03-29 NOTE — Patient Instructions (Signed)
Medication Instructions:  Your physician recommends that you continue on your current medications as directed. Please refer to the Current Medication list given to you today.  *If you need a refill on your cardiac medications before your next appointment, please call your pharmacy*   Lab Work: CBC, Cmet, and lipids If you have labs (blood work) drawn today and your tests are completely normal, you will receive your results only by: MyChart Message (if you have MyChart) OR A paper copy in the mail If you have any lab test that is abnormal or we need to change your treatment, we will call you to review the results.   Follow-Up: At [facility], you and your health needs are our priority.  As part of our continuing mission to provide you with exceptional heart care, we have created designated Provider Care Teams.  These Care Teams include your primary Cardiologist (physician) and Advanced Practice Providers (APPs -  Physician Assistants and Nurse Practitioners) who all work together to provide you with the care you need, when you need it.  We recommend signing up for the patient portal called "MyChart".  Sign up information is provided on this After Visit Summary.  MyChart is used to connect with patients for Virtual Visits (Telemedicine).  Patients are able to view lab/test results, encounter notes, upcoming appointments, etc.  Non-urgent messages can be sent to your provider as well.   To learn more about what you can do with MyChart, go to [email or web address].    Your next appointment:   1 year(s)  The format for your next appointment:   In Person  Provider:   Donato Schultz, MD

## 2022-03-29 NOTE — Assessment & Plan Note (Signed)
Prior LDL 171.  Has been intolerant to statin medications.  I discussed with him potential for alternatives.  If necessary, we can have him visit our lipid clinic for further discussion.  We will check a complete metabolic profile, lipid panel, CBC

## 2022-03-29 NOTE — Assessment & Plan Note (Signed)
First detected in 2013.  Overall doing well with good rate control.  His metoprolol is at 50 mg twice a day, prescription drug management.  We will continue.  No changes made.

## 2022-03-30 ENCOUNTER — Telehealth: Payer: Self-pay

## 2022-03-30 DIAGNOSIS — E7800 Pure hypercholesterolemia, unspecified: Secondary | ICD-10-CM

## 2022-03-30 LAB — CBC
Hematocrit: 42.6 % (ref 37.5–51.0)
Hemoglobin: 14.8 g/dL (ref 13.0–17.7)
MCH: 35 pg — ABNORMAL HIGH (ref 26.6–33.0)
MCHC: 34.7 g/dL (ref 31.5–35.7)
MCV: 101 fL — ABNORMAL HIGH (ref 79–97)
Platelets: 209 10*3/uL (ref 150–450)
RBC: 4.23 x10E6/uL (ref 4.14–5.80)
RDW: 13.4 % (ref 11.6–15.4)
WBC: 8.4 10*3/uL (ref 3.4–10.8)

## 2022-03-30 LAB — LIPID PANEL
Chol/HDL Ratio: 5 ratio (ref 0.0–5.0)
Cholesterol, Total: 240 mg/dL — ABNORMAL HIGH (ref 100–199)
HDL: 48 mg/dL (ref >39)
LDL Chol Calc (NIH): 177 mg/dL — ABNORMAL HIGH (ref 0–99)
Triglycerides: 86 mg/dL (ref 0–149)
VLDL Cholesterol Cal: 15 mg/dL (ref 5–40)

## 2022-03-30 LAB — COMPREHENSIVE METABOLIC PANEL
ALT: 24 IU/L (ref 0–44)
AST: 22 IU/L (ref 0–40)
Albumin/Globulin Ratio: 1.5 (ref 1.2–2.2)
Albumin: 4.3 g/dL (ref 3.8–4.8)
Alkaline Phosphatase: 82 IU/L (ref 44–121)
BUN/Creatinine Ratio: 11 (ref 10–24)
BUN: 11 mg/dL (ref 8–27)
Bilirubin Total: 0.7 mg/dL (ref 0.0–1.2)
CO2: 25 mmol/L (ref 20–29)
Calcium: 9.8 mg/dL (ref 8.6–10.2)
Chloride: 102 mmol/L (ref 96–106)
Creatinine, Ser: 1.04 mg/dL (ref 0.76–1.27)
Globulin, Total: 2.8 g/dL (ref 1.5–4.5)
Glucose: 99 mg/dL (ref 70–99)
Potassium: 5.3 mmol/L — ABNORMAL HIGH (ref 3.5–5.2)
Sodium: 143 mmol/L (ref 134–144)
Total Protein: 7.1 g/dL (ref 6.0–8.5)
eGFR: 74 mL/min/{1.73_m2} (ref >59)

## 2022-03-30 NOTE — Telephone Encounter (Signed)
-----   Message from Jake Bathe, MD sent at 03/30/2022  6:44 AM EDT ----- LDL remains high at 177 as expected. Let's have him sit down with the lipid clinic to discuss statin alternatives.   Hgb 14.8 normal Potassium is a little high at 5.3 (should be ok) Normal liver function  Donato Schultz, MD

## 2022-03-30 NOTE — Telephone Encounter (Signed)
The patient has been notified of the result and verbalized understanding.  All questions (if any) were answered. Theresia Majors, RN 03/30/2022 12:31 PM  Patient has been referred to Lipid Clinic

## 2022-04-08 ENCOUNTER — Ambulatory Visit (INDEPENDENT_AMBULATORY_CARE_PROVIDER_SITE_OTHER): Payer: Medicare Other | Admitting: *Deleted

## 2022-04-08 DIAGNOSIS — I4821 Permanent atrial fibrillation: Secondary | ICD-10-CM | POA: Diagnosis not present

## 2022-04-08 DIAGNOSIS — Z5181 Encounter for therapeutic drug level monitoring: Secondary | ICD-10-CM | POA: Diagnosis not present

## 2022-04-08 LAB — POCT INR: INR: 3 (ref 2.0–3.0)

## 2022-04-08 NOTE — Patient Instructions (Signed)
Description   Continue taking 5 mg daily.  Recheck INR in 6 weeks.  Coumadin Clinic [phone]

## 2022-04-24 ENCOUNTER — Other Ambulatory Visit: Payer: Self-pay | Admitting: Cardiology

## 2022-05-20 ENCOUNTER — Ambulatory Visit: Payer: Medicare Other | Attending: Cardiology | Admitting: *Deleted

## 2022-05-20 DIAGNOSIS — Z5181 Encounter for therapeutic drug level monitoring: Secondary | ICD-10-CM | POA: Diagnosis not present

## 2022-05-20 DIAGNOSIS — I4821 Permanent atrial fibrillation: Secondary | ICD-10-CM | POA: Insufficient documentation

## 2022-05-20 LAB — POCT INR: INR: 2.9 (ref 2.0–3.0)

## 2022-05-20 NOTE — Patient Instructions (Signed)
Description   Continue taking 5 mg daily.  Recheck INR in 6 weeks.  Coumadin Clinic [phone]

## 2022-07-01 ENCOUNTER — Ambulatory Visit: Payer: Medicare Other | Attending: Cardiology

## 2022-07-01 DIAGNOSIS — Z5181 Encounter for therapeutic drug level monitoring: Secondary | ICD-10-CM

## 2022-07-01 DIAGNOSIS — I4821 Permanent atrial fibrillation: Secondary | ICD-10-CM

## 2022-07-01 LAB — POCT INR: INR: 2.8 (ref 2.0–3.0)

## 2022-07-01 NOTE — Patient Instructions (Signed)
Description   Continue taking 5 mg daily.  Recheck INR in 5 weeks.  Coumadin Clinic [phone]

## 2022-07-25 ENCOUNTER — Other Ambulatory Visit: Payer: Self-pay | Admitting: Cardiology

## 2022-07-25 DIAGNOSIS — I4821 Permanent atrial fibrillation: Secondary | ICD-10-CM

## 2022-08-05 ENCOUNTER — Ambulatory Visit: Payer: Medicare Other | Attending: Cardiology

## 2022-08-05 DIAGNOSIS — I4821 Permanent atrial fibrillation: Secondary | ICD-10-CM | POA: Diagnosis not present

## 2022-08-05 DIAGNOSIS — Z5181 Encounter for therapeutic drug level monitoring: Secondary | ICD-10-CM

## 2022-08-05 LAB — POCT INR: INR: 3.2 — AB (ref 2.0–3.0)

## 2022-08-05 NOTE — Patient Instructions (Signed)
Description   Only take 0.5 tablet today and then continue taking 5 mg daily.  Recheck INR in 6 weeks.   Coumadin Clinic [phone]

## 2022-09-16 ENCOUNTER — Ambulatory Visit: Payer: Medicare Other | Attending: Cardiology | Admitting: *Deleted

## 2022-09-16 DIAGNOSIS — I4821 Permanent atrial fibrillation: Secondary | ICD-10-CM | POA: Diagnosis present

## 2022-09-16 DIAGNOSIS — Z5181 Encounter for therapeutic drug level monitoring: Secondary | ICD-10-CM

## 2022-09-16 LAB — POCT INR: INR: 3.7 — AB (ref 2.0–3.0)

## 2022-09-16 NOTE — Patient Instructions (Signed)
Description   Do not take any warfarin today and then continue taking 5 mg daily. Start eating 3 leafy veggies a week with your new diet plan. Recheck INR in 3 weeks.   Coumadin Clinic [phone]

## 2022-10-07 ENCOUNTER — Ambulatory Visit: Payer: Medicare Other | Attending: Cardiology | Admitting: *Deleted

## 2022-10-07 DIAGNOSIS — Z5181 Encounter for therapeutic drug level monitoring: Secondary | ICD-10-CM | POA: Insufficient documentation

## 2022-10-07 DIAGNOSIS — I4821 Permanent atrial fibrillation: Secondary | ICD-10-CM

## 2022-10-07 LAB — POCT INR: INR: 3.5 — AB (ref 2.0–3.0)

## 2022-10-07 NOTE — Patient Instructions (Signed)
Description   Do not take any warfarin today and then start taking 5 mg daily except 2.5mg  on Mondays. Continue eating 3 leafy veggies a week with your new diet plan. Recheck INR in 4 weeks.   Coumadin Clinic [phone]

## 2022-11-04 ENCOUNTER — Ambulatory Visit: Payer: Medicare Other | Attending: Cardiology | Admitting: *Deleted

## 2022-11-04 DIAGNOSIS — I4821 Permanent atrial fibrillation: Secondary | ICD-10-CM | POA: Insufficient documentation

## 2022-11-04 DIAGNOSIS — Z5181 Encounter for therapeutic drug level monitoring: Secondary | ICD-10-CM | POA: Diagnosis not present

## 2022-11-04 LAB — POCT INR: INR: 3.5 — AB (ref 2.0–3.0)

## 2022-11-04 NOTE — Patient Instructions (Signed)
Description   Do not take any warfarin today and then start taking 5 mg daily except 2.49m on Mondays and Fridays. Continue eating 3 leafy veggies a week with your new diet plan. Recheck INR in 4 weeks.   Coumadin Clinic [phone]

## 2022-12-02 ENCOUNTER — Ambulatory Visit: Payer: Medicare Other | Attending: Internal Medicine

## 2022-12-02 DIAGNOSIS — I4821 Permanent atrial fibrillation: Secondary | ICD-10-CM | POA: Diagnosis present

## 2022-12-02 DIAGNOSIS — Z5181 Encounter for therapeutic drug level monitoring: Secondary | ICD-10-CM | POA: Diagnosis present

## 2022-12-02 LAB — POCT INR: INR: 2.4 (ref 2.0–3.0)

## 2022-12-02 NOTE — Patient Instructions (Signed)
Description   Continue taking 5 mg daily except 2.'5mg'$  on Mondays and Fridays.  Continue eating 3 leafy veggies a week with your new diet plan.  Recheck INR in 5 weeks.   Coumadin Clinic [phone]

## 2022-12-10 ENCOUNTER — Telehealth: Payer: Self-pay

## 2022-12-10 NOTE — Patient Outreach (Signed)
  Care Coordination   12/10/2022 Name: [name] [name] MRN: [id number] DOB: 06/21/1946   Care Coordination Outreach Attempts:  An unsuccessful telephone outreach was attempted today to offer the patient information about available care coordination services as a benefit of their health plan.   Follow Up Plan:  Additional outreach attempts will be made to offer the patient care coordination information and services.   Encounter Outcome:  No Answer   Care Coordination Interventions:  No, not indicated    SIG  Peter Garter RN, BSN,CCM, CDE Care Management Coordinator [facility] [phone]

## 2022-12-24 ENCOUNTER — Telehealth: Payer: Self-pay

## 2022-12-24 NOTE — Patient Outreach (Signed)
  Care Coordination   12/24/2022 Name: [name] [name] MRN: [id number] DOB: 24-Apr-1946   Care Coordination Outreach Attempts:  A second unsuccessful outreach was attempted today to offer the patient with information about available care coordination services as a benefit of their health plan.     Follow Up Plan:  Additional outreach attempts will be made to offer the patient care coordination information and services.   Encounter Outcome:  No Answer   Care Coordination Interventions:  No, not indicated    SIG Dudley Major RN, BSN,CCM, CDE Care Management Coordinator [facility] [phone]

## 2023-01-05 ENCOUNTER — Telehealth: Payer: Self-pay

## 2023-01-05 NOTE — Patient Outreach (Signed)
  Care Coordination   01/05/2023 Name: [name] [name] MRN: [id number] DOB: 10-26-1945   Care Coordination Outreach Attempts:  A third unsuccessful outreach was attempted today to offer the patient with information about available care coordination services as a benefit of their health plan.   Follow Up Plan:  No further outreach attempts will be made at this time. We have been unable to contact the patient to offer or enroll patient in care coordination services  Encounter Outcome:  No Answer   Care Coordination Interventions:  No, not indicated    SIG  Dudley Major RN, BSN,CCM, CDE Care Management Coordinator [facility] [phone]

## 2023-01-06 ENCOUNTER — Ambulatory Visit: Payer: Medicare Other | Attending: Cardiology | Admitting: *Deleted

## 2023-01-06 DIAGNOSIS — Z5181 Encounter for therapeutic drug level monitoring: Secondary | ICD-10-CM | POA: Diagnosis present

## 2023-01-06 DIAGNOSIS — I4821 Permanent atrial fibrillation: Secondary | ICD-10-CM

## 2023-01-06 LAB — POCT INR: POC INR: 2.2

## 2023-01-06 NOTE — Patient Instructions (Addendum)
Description   Continue taking 5 mg daily except 2.5mg  on Mondays and Fridays.  Continue eating 3 leafy veggies a week with your new diet plan.  Recheck INR in 3 weeks.  ( Wants appt when wife comes) Coumadin Clinic [phone]

## 2023-01-27 ENCOUNTER — Ambulatory Visit: Payer: Medicare Other | Attending: Cardiology

## 2023-01-27 DIAGNOSIS — Z5181 Encounter for therapeutic drug level monitoring: Secondary | ICD-10-CM

## 2023-01-27 DIAGNOSIS — I4821 Permanent atrial fibrillation: Secondary | ICD-10-CM | POA: Diagnosis not present

## 2023-01-27 LAB — POCT INR: INR: 1.7 — AB (ref 2.0–3.0)

## 2023-01-27 NOTE — Patient Instructions (Signed)
Description   Take 1.5 tablets today and then continue taking 5 mg daily except 2.5mg  on Mondays and Fridays.  Continue eating 3 leafy veggies a week with your new diet plan.  Recheck INR in 4 weeks. (Wants appt when wife comes) Coumadin Clinic [phone]

## 2023-02-24 ENCOUNTER — Ambulatory Visit: Payer: Medicare Other | Attending: Cardiology

## 2023-02-24 DIAGNOSIS — Z5181 Encounter for therapeutic drug level monitoring: Secondary | ICD-10-CM | POA: Insufficient documentation

## 2023-02-24 DIAGNOSIS — I4821 Permanent atrial fibrillation: Secondary | ICD-10-CM | POA: Diagnosis present

## 2023-02-24 LAB — POCT INR: INR: 2.6 (ref 2.0–3.0)

## 2023-02-24 NOTE — Patient Instructions (Signed)
Description   Continue taking 5 mg daily except 2.5mg on Mondays and Fridays.  Continue eating 3 leafy veggies a week with your new diet plan.  Recheck INR in 5 weeks.   Coumadin Clinic [phone]      

## 2023-03-31 ENCOUNTER — Ambulatory Visit: Payer: Medicare Other | Attending: Internal Medicine

## 2023-03-31 DIAGNOSIS — Z5181 Encounter for therapeutic drug level monitoring: Secondary | ICD-10-CM | POA: Diagnosis not present

## 2023-03-31 DIAGNOSIS — I4821 Permanent atrial fibrillation: Secondary | ICD-10-CM | POA: Insufficient documentation

## 2023-03-31 LAB — POCT INR: INR: 2.1 (ref 2.0–3.0)

## 2023-03-31 NOTE — Patient Instructions (Signed)
Description   Continue taking 5 mg daily except 2.5mg  on Mondays and Fridays.  Continue eating 3 leafy veggies a week with your new diet plan.  Recheck INR in 6 weeks Coumadin Clinic [phone]

## 2023-04-21 ENCOUNTER — Other Ambulatory Visit: Payer: Self-pay | Admitting: Cardiology

## 2023-04-21 DIAGNOSIS — I4821 Permanent atrial fibrillation: Secondary | ICD-10-CM

## 2023-04-21 NOTE — Telephone Encounter (Signed)
Refill request for warfarin:  Last INR was 2.1 on 03/31/23 Next INR due 05/16/23 LOV was 03/29/22 (Has appt scheduled)  Refill approved.

## 2023-05-16 ENCOUNTER — Ambulatory Visit: Payer: Medicare Other | Attending: Cardiology

## 2023-05-16 DIAGNOSIS — Z5181 Encounter for therapeutic drug level monitoring: Secondary | ICD-10-CM | POA: Insufficient documentation

## 2023-05-16 DIAGNOSIS — I4821 Permanent atrial fibrillation: Secondary | ICD-10-CM | POA: Diagnosis not present

## 2023-05-16 LAB — POCT INR: INR: 2.4 (ref 2.0–3.0)

## 2023-05-16 NOTE — Patient Instructions (Signed)
Continue taking 5 mg daily except 2.5mg  on Mondays and Fridays.  Continue eating 3 leafy veggies a week with your new diet plan.  Recheck INR in 6 weeks Coumadin Clinic [phone]

## 2023-05-20 ENCOUNTER — Encounter: Payer: Self-pay | Admitting: Cardiology

## 2023-05-20 ENCOUNTER — Ambulatory Visit: Payer: Medicare Other | Attending: Cardiology | Admitting: Cardiology

## 2023-05-20 VITALS — BP 128/86 | HR 136 | Ht 70.0 in | Wt 319.8 lb

## 2023-05-20 DIAGNOSIS — Z789 Other specified health status: Secondary | ICD-10-CM | POA: Diagnosis present

## 2023-05-20 DIAGNOSIS — I4821 Permanent atrial fibrillation: Secondary | ICD-10-CM | POA: Insufficient documentation

## 2023-05-20 DIAGNOSIS — E7800 Pure hypercholesterolemia, unspecified: Secondary | ICD-10-CM | POA: Diagnosis present

## 2023-05-20 NOTE — Progress Notes (Signed)
  Cardiology Office Note:  .   Date:  05/20/2023  ID:  [name] [name], DOB Jul 24, 1946, MRN [id number] PCP: [facility] Providers Cardiologist:  Donato Schultz, MD    History of Present Illness: .   [name] [name] is a 77 y.o. male Discussed the use of AI scribe software for clinical note transcription with the patient, who gave verbal consent to proceed.  History of Present Illness   The patient, with a history of atrial fibrillation, hypertension, and high cholesterol, reports no new symptoms or issues. He denies chest pain, shortness of breath, and bleeding issues with his current medication, Coumadin. He has had issues with statin intolerance in the past, experiencing severe muscle pain with Crestor, Welchol, and Zetia. Despite his high LDL cholesterol levels, he has not had any heart attacks or major blockages. His kidney function and hemoglobin levels are good, but his hemoglobin A1c is slightly elevated. He is currently on metoprolol, warfarin, and a low dose of lisinopril.         Studies Reviewed: Marland Kitchen   EKG Interpretation Date/Time:  Friday May 20 2023 08:15:52 EDT Ventricular Rate:  66 PR Interval:    QRS Duration:  90 QT Interval:  440 QTC Calculation: 461 R Axis:   53  Text Interpretation: Atrial fibrillation ST & T wave abnormality, consider lateral ischemia Since last tracing rate slower Confirmed by Donato Schultz ([id number]) on 05/20/2023 8:33:53 AM    LABS LDL: 190 (12/23/2022) Creatinine: 0.9 mg/dL Hemoglobin: 14 g/dL Hemoglobin E9B: 2.8% Risk Assessment/Calculations:                 Physical Exam:   VS:  BP 128/86   Pulse (!) 136   Ht 5\' 10"  (1.778 m)   Wt (!) 319 lb 12.8 oz (145.1 kg)   SpO2 93%   BMI 45.89 kg/m    Wt Readings from Last 3 Encounters:  05/20/23 (!) 319 lb 12.8 oz (145.1 kg)  03/29/22 (!) 305 lb (138.3 kg)  04/01/21 (!) 324 lb (147 kg)    GEN: Well nourished, well developed in no acute  distress NECK: No JVD; No carotid bruits CARDIAC: IRRR, no murmurs, rubs, gallops RESPIRATORY:  Clear to auscultation without rales, wheezing or rhonchi  ABDOMEN: Soft, non-tender, non-distended EXTREMITIES:  No edema; No deformity   ASSESSMENT AND PLAN: .    Assessment and Plan    Atrial Fibrillation Longstanding Persistent Stable on Metoprolol 50mg  BID and Warfarin with therapeutic INR. No symptoms of chest pain or shortness of breath. -Continue current regimen.  Hypertension Stable on Lisinopril 10mg  daily. Blood pressure at visit was 128/86. -Continue current regimen.  Hyperlipidemia LDL cholesterol elevated at 190 on 12/23/2022. History of statin intolerance with muscle symptoms. Despite high LDL, no history of heart attack or major blockages. -Discussed potential referral to lipid clinic for consideration of non-statin therapies, including newer injectable options.  Prediabetes Hemoglobin A1c at 5.9, slightly elevated but not in diabetic range. -Encouraged reduction in sugar and carbohydrate intake.  Follow-up in 1 year unless changes occur.              Signed, Donato Schultz, MD

## 2023-05-20 NOTE — Patient Instructions (Signed)

## 2023-06-27 ENCOUNTER — Ambulatory Visit: Payer: Medicare Other | Attending: Cardiology

## 2023-06-27 DIAGNOSIS — I4821 Permanent atrial fibrillation: Secondary | ICD-10-CM | POA: Insufficient documentation

## 2023-06-27 DIAGNOSIS — Z5181 Encounter for therapeutic drug level monitoring: Secondary | ICD-10-CM | POA: Diagnosis not present

## 2023-06-27 LAB — POCT INR: INR: 2.8 (ref 2.0–3.0)

## 2023-06-27 NOTE — Patient Instructions (Signed)
Continue taking 5 mg daily except 2.5mg  on Mondays and Fridays.  Continue eating 3 leafy veggies a week with your new diet plan.  Recheck INR in 6 weeks Coumadin Clinic [phone]

## 2023-07-17 ENCOUNTER — Other Ambulatory Visit: Payer: Self-pay | Admitting: Cardiology

## 2023-08-09 ENCOUNTER — Ambulatory Visit: Payer: Medicare Other | Attending: Cardiology | Admitting: *Deleted

## 2023-08-09 DIAGNOSIS — I4821 Permanent atrial fibrillation: Secondary | ICD-10-CM | POA: Diagnosis not present

## 2023-08-09 DIAGNOSIS — Z5181 Encounter for therapeutic drug level monitoring: Secondary | ICD-10-CM | POA: Diagnosis not present

## 2023-08-09 LAB — POCT INR: INR: 2.2 (ref 2.0–3.0)

## 2023-08-09 NOTE — Patient Instructions (Signed)
Description   Continue taking 5 mg daily except 2.5mg  on Mondays and Fridays.  Continue eating 3 leafy veggies a week with your new diet plan.  Recheck INR in 6 weeks Coumadin Clinic [phone]

## 2023-09-20 ENCOUNTER — Ambulatory Visit: Payer: Medicare Other | Attending: Cardiology | Admitting: *Deleted

## 2023-09-20 DIAGNOSIS — Z5181 Encounter for therapeutic drug level monitoring: Secondary | ICD-10-CM | POA: Diagnosis present

## 2023-09-20 DIAGNOSIS — I4821 Permanent atrial fibrillation: Secondary | ICD-10-CM | POA: Insufficient documentation

## 2023-09-20 LAB — POCT INR: INR: 2.1 (ref 2.0–3.0)

## 2023-09-20 NOTE — Patient Instructions (Signed)
Description   Continue taking 5 mg daily except 2.5mg  on Mondays and Fridays.  Continue eating 3 leafy veggies a week with your new diet plan.  Recheck INR in 6 weeks Coumadin Clinic [phone]

## 2023-11-01 ENCOUNTER — Ambulatory Visit: Payer: Medicare Other | Attending: Cardiovascular Disease

## 2023-11-01 DIAGNOSIS — Z5181 Encounter for therapeutic drug level monitoring: Secondary | ICD-10-CM | POA: Insufficient documentation

## 2023-11-01 DIAGNOSIS — I4821 Permanent atrial fibrillation: Secondary | ICD-10-CM | POA: Insufficient documentation

## 2023-11-01 LAB — POCT INR: INR: 2.8 (ref 2.0–3.0)

## 2023-11-01 NOTE — Patient Instructions (Signed)
Continue taking 5 mg daily except 2.5mg  on Mondays and Fridays.  Continue eating 3 leafy veggies a week with your new diet plan.  Recheck INR in 3 weeks Coumadin Clinic [phone]

## 2023-11-24 ENCOUNTER — Ambulatory Visit: Payer: Medicare Other | Attending: Cardiology

## 2023-11-24 DIAGNOSIS — Z5181 Encounter for therapeutic drug level monitoring: Secondary | ICD-10-CM | POA: Insufficient documentation

## 2023-11-24 DIAGNOSIS — I4821 Permanent atrial fibrillation: Secondary | ICD-10-CM | POA: Diagnosis not present

## 2023-11-24 LAB — POCT INR: INR: 2.4 (ref 2.0–3.0)

## 2023-11-24 NOTE — Patient Instructions (Signed)
Description   Continue taking 5 mg daily except 2.5mg  on Mondays and Fridays.  Continue eating 3 leafy veggies a week with your new diet plan.  Recheck INR in 6 weeks Coumadin Clinic [phone]

## 2024-01-05 ENCOUNTER — Ambulatory Visit: Attending: Cardiology | Admitting: *Deleted

## 2024-01-05 DIAGNOSIS — Z7901 Long term (current) use of anticoagulants: Secondary | ICD-10-CM | POA: Diagnosis present

## 2024-01-05 DIAGNOSIS — I4821 Permanent atrial fibrillation: Secondary | ICD-10-CM | POA: Insufficient documentation

## 2024-01-05 DIAGNOSIS — Z5181 Encounter for therapeutic drug level monitoring: Secondary | ICD-10-CM | POA: Insufficient documentation

## 2024-01-05 LAB — POCT INR: POC INR: 2.3

## 2024-01-05 NOTE — Patient Instructions (Signed)
 Description   Continue taking 5 mg daily except 2.5mg  on Mondays and Fridays.  Continue eating 3 leafy veggies a week with your new diet plan.  Recheck INR in 6 weeks Coumadin Clinic [phone]      1st Floor: - Lobby - Registration  - Pharmacy  - Lab - Cafe  2nd Floor: - PV Lab - Diagnostic Testing (echo, CT, nuclear med)  3rd Floor: - Vacant  4th Floor: - TCTS (cardiothoracic surgery) - AFib Clinic - Structural Heart Clinic - Vascular Surgery  - Vascular Ultrasound  5th Floor: - HeartCare Cardiology (general and EP) - Clinical Pharmacy for coumadin, hypertension, lipid, weight-loss medications, and med management appointments    Valet parking services will be available as well.

## 2024-01-14 ENCOUNTER — Other Ambulatory Visit: Payer: Self-pay | Admitting: Cardiology

## 2024-01-14 DIAGNOSIS — I4821 Permanent atrial fibrillation: Secondary | ICD-10-CM

## 2024-01-23 ENCOUNTER — Other Ambulatory Visit: Payer: Self-pay | Admitting: Cardiology

## 2024-02-16 ENCOUNTER — Ambulatory Visit: Attending: Cardiology | Admitting: *Deleted

## 2024-02-16 DIAGNOSIS — I4821 Permanent atrial fibrillation: Secondary | ICD-10-CM | POA: Diagnosis present

## 2024-02-16 DIAGNOSIS — Z5181 Encounter for therapeutic drug level monitoring: Secondary | ICD-10-CM | POA: Insufficient documentation

## 2024-02-16 LAB — POCT INR: POC INR: 2

## 2024-02-16 NOTE — Patient Instructions (Signed)
 Description   Continue taking 5 mg daily except 2.5mg  on Mondays and Fridays.  Continue eating 3 leafy veggies a week with your new diet plan.  Recheck INR in 4 weeks Coumadin  Clinic [phone]

## 2024-03-15 ENCOUNTER — Ambulatory Visit: Attending: Internal Medicine

## 2024-03-15 DIAGNOSIS — I4821 Permanent atrial fibrillation: Secondary | ICD-10-CM | POA: Insufficient documentation

## 2024-03-15 DIAGNOSIS — Z5181 Encounter for therapeutic drug level monitoring: Secondary | ICD-10-CM | POA: Diagnosis present

## 2024-03-15 LAB — POCT INR: INR: 1.9 — AB (ref 2.0–3.0)

## 2024-03-15 NOTE — Patient Instructions (Signed)
 Description   Take 1.5 tablets today and then continue taking 5 mg daily except 2.5mg  on Mondays and Fridays.  Continue eating 3 leafy veggies a week with your new diet plan.  Recheck INR in 5 weeks Coumadin  Clinic [phone]

## 2024-03-15 NOTE — Progress Notes (Signed)
Please see anticoagulation encounter.

## 2024-04-18 ENCOUNTER — Other Ambulatory Visit: Payer: Self-pay | Admitting: Cardiology

## 2024-04-18 DIAGNOSIS — I4821 Permanent atrial fibrillation: Secondary | ICD-10-CM

## 2024-04-18 NOTE — Telephone Encounter (Signed)
 Pt's Warfarin now managed by PCP. Called pharmacy and requested refill request be sent to PCP.

## 2024-04-19 ENCOUNTER — Encounter

## 2024-04-23 ENCOUNTER — Other Ambulatory Visit: Payer: Self-pay | Admitting: Cardiology

## 2024-05-31 ENCOUNTER — Ambulatory Visit: Attending: Cardiology | Admitting: Cardiology

## 2024-05-31 VITALS — BP 139/89 | HR 66 | Ht 70.0 in | Wt 318.0 lb

## 2024-05-31 DIAGNOSIS — Z789 Other specified health status: Secondary | ICD-10-CM | POA: Insufficient documentation

## 2024-05-31 DIAGNOSIS — I4821 Permanent atrial fibrillation: Secondary | ICD-10-CM | POA: Insufficient documentation

## 2024-05-31 DIAGNOSIS — Z7901 Long term (current) use of anticoagulants: Secondary | ICD-10-CM | POA: Insufficient documentation

## 2024-05-31 NOTE — Patient Instructions (Signed)

## 2024-06-01 NOTE — Progress Notes (Signed)
 Cardiology Office Note:  .   Date:  06/01/2024  ID:  [name] [name], DOB 12-31-1945, MRN [id number] PCP: [facility] @ [facility] Providers Cardiologist:  Oneil Parchment, MD     History of Present Illness: .   [name] [name] is a 78 y.o. male Discussed the use of AI scribe software for clinical note transcription with the patient, who gave verbal consent to proceed.  History of Present Illness [name] [name] is an 78 year old male with persistent atrial fibrillation and hyperlipidemia who presents for follow-up.  He is managing his persistent atrial fibrillation with metoprolol  50 mg twice daily and warfarin.  He has a history of hyperlipidemia and previously experienced intolerance to Crestor, but has tolerated Zetia. His LDL cholesterol remains elevated at 190 mg/dL. He has not experienced any myocardial infarctions.  He is prediabetic with a hemoglobin A1c of 5.8%. He takes lisinopril 10 mg and hydrochlorothiazide 25 mg daily, which was initially started at a lower dose and then doubled.  He experiences significant stress related to his wife's health, describing himself as a 'nervous wreck'. He shares a story about a friend whose wife passed away unexpectedly, which has contributed to his anxiety.  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 He describes the work environment as demanding, leading to his early retirement at age 57 after 32 years of service. He witnessed significant industry changes and job losses during his tenure.     Studies Reviewed: SABRA   EKG Interpretation Date/Time:  Thursday May 31 2024 09:23:21 EDT Ventricular Rate:  66 PR Interval:    QRS Duration:  86 QT Interval:  412 QTC Calculation: 431 R Axis:   66  Text Interpretation: Atrial fibrillation Nonspecific ST and T wave abnormality When compared with ECG of 20-May-2023 08:15, No significant change was found Confirmed by Parchment Oneil ([id number]) on  05/31/2024 10:07:57 AM    Results LABS LDL: 190 mg/dL Hemoglobin J8r: 4.1% Risk Assessment/Calculations:            Physical Exam:   VS:  BP 139/89 (BP Location: Left Arm, Cuff Size: Large)   Pulse 66   Ht 5' 10 (1.778 m)   Wt (!) 318 lb (144.2 kg)   BMI 45.63 kg/m    Wt Readings from Last 3 Encounters:  05/31/24 (!) 318 lb (144.2 kg)  05/20/23 (!) 319 lb 12.8 oz (145.1 kg)  03/29/22 (!) 305 lb (138.3 kg)    GEN: Well nourished, well developed in no acute distress NECK: No JVD; No carotid bruits CARDIAC: RRR, no murmurs, no rubs, no gallops RESPIRATORY:  Clear to auscultation without rales, wheezing or rhonchi  ABDOMEN: Soft, non-tender, non-distended EXTREMITIES:  No edema; No deformity   ASSESSMENT AND PLAN: .    Assessment and Plan Assessment & Plan Permanent atrial fibrillation Atrial fibrillation is well-controlled with current medication regimen. No recent myocardial infarctions or cardiac events reported. - Continue metoprolol  50 mg BID - Continue warfarin  Essential hypertension Hypertension is well-managed with current medication regimen. - Continue lisinopril 10 mg daily - Continue hydrochlorothiazide 25 mg daily  Hyperlipidemia LDL cholesterol remains elevated at 190 mg/dL. Previous issues with Crestor and Zetia noted. High LDL levels warrant attention to reduce cardiovascular risk. - Consider referral to lipid clinic for further management - Discuss alternative lipid-lowering medications with pharmacy team if interested  Prediabetes Hemoglobin A1c is 5.8%, indicating prediabetes. Condition is stable with current management.  Dispo:1 yr  Signed, Oneil Parchment, MD

## 2024-07-30 ENCOUNTER — Telehealth: Payer: Self-pay | Admitting: Cardiology

## 2024-07-30 MED ORDER — METOPROLOL TARTRATE 50 MG PO TABS: 50.0000 mg | ORAL_TABLET | Freq: Two times a day (BID) | ORAL | 3 refills | Status: AC

## 2024-07-30 NOTE — Telephone Encounter (Signed)
 Pt's medication was sent to pt's pharmacy as requested. Confirmation received.

## 2024-07-30 NOTE — Telephone Encounter (Signed)
*  STAT* If patient is at the pharmacy, call can be transferred to refill team.   1. Which medications need to be refilled? (please list name of each medication and dose if known)   metoprolol  tartrate (LOPRESSOR ) 50 MG tablet     2. Would you like to learn more about the convenience, safety, & potential cost savings by using the [facility]? no   3. Are you open to using the [facility] (Type [facility]. ). no   4. Which pharmacy/location (including street and city if local pharmacy) is medication to be sent to? [id number] [place], [place] - [address]     5. Do they need a 30 day or 90 day supply? 90 day    Pt is out of medication
